# Patient Record
Sex: Male | Born: 1955 | Race: White | Hispanic: No | Marital: Single | Smoking: Current every day smoker
Health system: Southern US, Community
[De-identification: ages and names within clinical notes are randomized; demographics above are authoritative.]

## PROBLEM LIST (undated history)

## (undated) DIAGNOSIS — I4891 Unspecified atrial fibrillation: Secondary | ICD-10-CM

## (undated) DIAGNOSIS — K219 Gastro-esophageal reflux disease without esophagitis: Secondary | ICD-10-CM

## (undated) DIAGNOSIS — D126 Benign neoplasm of colon, unspecified: Secondary | ICD-10-CM

## (undated) DIAGNOSIS — E890 Postprocedural hypothyroidism: Secondary | ICD-10-CM

## (undated) DIAGNOSIS — I428 Other cardiomyopathies: Secondary | ICD-10-CM

## (undated) DIAGNOSIS — E0590 Thyrotoxicosis, unspecified without thyrotoxic crisis or storm: Secondary | ICD-10-CM

## (undated) DIAGNOSIS — B1710 Acute hepatitis C without hepatic coma: Secondary | ICD-10-CM

## (undated) DIAGNOSIS — J449 Chronic obstructive pulmonary disease, unspecified: Secondary | ICD-10-CM

## (undated) DIAGNOSIS — I10 Essential (primary) hypertension: Secondary | ICD-10-CM

## (undated) DIAGNOSIS — D696 Thrombocytopenia, unspecified: Secondary | ICD-10-CM

## (undated) HISTORY — DX: Chronic obstructive pulmonary disease, unspecified: J44.9

## (undated) HISTORY — DX: Other cardiomyopathies: I42.8

## (undated) HISTORY — DX: Essential (primary) hypertension: I10

## (undated) HISTORY — DX: Thrombocytopenia, unspecified: D69.6

## (undated) HISTORY — DX: Gastro-esophageal reflux disease without esophagitis: K21.9

## (undated) HISTORY — DX: Postprocedural hypothyroidism: E89.0

## (undated) HISTORY — DX: Thyrotoxicosis, unspecified without thyrotoxic crisis or storm: E05.90

## (undated) HISTORY — DX: Acute hepatitis C without hepatic coma: B17.10

## (undated) HISTORY — DX: Benign neoplasm of colon, unspecified: D12.6

## (undated) HISTORY — DX: Unspecified atrial fibrillation: I48.91

## (undated) HISTORY — PX: HERNIA REPAIR: SHX51

---

## 1999-09-17 ENCOUNTER — Encounter: Payer: Self-pay | Admitting: Emergency Medicine

## 1999-09-17 ENCOUNTER — Emergency Department (HOSPITAL_COMMUNITY): Admission: EM | Admit: 1999-09-17 | Discharge: 1999-09-17 | Payer: Self-pay | Admitting: Emergency Medicine

## 2005-10-31 ENCOUNTER — Emergency Department (HOSPITAL_COMMUNITY): Admission: EM | Admit: 2005-10-31 | Discharge: 2005-10-31 | Payer: Self-pay | Admitting: Emergency Medicine

## 2005-11-02 ENCOUNTER — Ambulatory Visit (HOSPITAL_COMMUNITY): Admission: RE | Admit: 2005-11-02 | Discharge: 2005-11-02 | Payer: Self-pay | Admitting: General Surgery

## 2005-11-02 ENCOUNTER — Encounter (INDEPENDENT_AMBULATORY_CARE_PROVIDER_SITE_OTHER): Payer: Self-pay | Admitting: *Deleted

## 2008-07-25 ENCOUNTER — Inpatient Hospital Stay (HOSPITAL_COMMUNITY): Admission: EM | Admit: 2008-07-25 | Discharge: 2008-07-29 | Payer: Self-pay | Admitting: Emergency Medicine

## 2008-07-25 ENCOUNTER — Ambulatory Visit: Payer: Self-pay | Admitting: Endocrinology

## 2008-07-25 ENCOUNTER — Ambulatory Visit: Payer: Self-pay | Admitting: Cardiology

## 2008-07-27 ENCOUNTER — Encounter: Payer: Self-pay | Admitting: Cardiology

## 2008-07-28 ENCOUNTER — Ambulatory Visit: Payer: Self-pay | Admitting: Oncology

## 2008-07-30 ENCOUNTER — Ambulatory Visit: Payer: Self-pay | Admitting: Oncology

## 2008-07-31 ENCOUNTER — Ambulatory Visit: Payer: Self-pay | Admitting: Cardiology

## 2008-08-03 ENCOUNTER — Ambulatory Visit: Payer: Self-pay | Admitting: Endocrinology

## 2008-08-03 DIAGNOSIS — D696 Thrombocytopenia, unspecified: Secondary | ICD-10-CM

## 2008-08-03 DIAGNOSIS — J449 Chronic obstructive pulmonary disease, unspecified: Secondary | ICD-10-CM

## 2008-08-03 DIAGNOSIS — I4891 Unspecified atrial fibrillation: Secondary | ICD-10-CM

## 2008-08-03 DIAGNOSIS — J4489 Other specified chronic obstructive pulmonary disease: Secondary | ICD-10-CM | POA: Insufficient documentation

## 2008-08-03 DIAGNOSIS — I10 Essential (primary) hypertension: Secondary | ICD-10-CM | POA: Insufficient documentation

## 2008-08-03 DIAGNOSIS — F17200 Nicotine dependence, unspecified, uncomplicated: Secondary | ICD-10-CM

## 2008-08-03 HISTORY — DX: Essential (primary) hypertension: I10

## 2008-08-03 HISTORY — DX: Chronic obstructive pulmonary disease, unspecified: J44.9

## 2008-08-03 HISTORY — DX: Unspecified atrial fibrillation: I48.91

## 2008-08-03 HISTORY — DX: Thrombocytopenia, unspecified: D69.6

## 2008-08-03 LAB — CONVERTED CEMR LAB: Free T4: 4.8 ng/dL — ABNORMAL HIGH (ref 0.6–1.6)

## 2008-08-06 ENCOUNTER — Ambulatory Visit: Payer: Self-pay | Admitting: Cardiovascular Disease

## 2008-08-13 ENCOUNTER — Ambulatory Visit: Payer: Self-pay | Admitting: Cardiology

## 2008-08-17 ENCOUNTER — Ambulatory Visit: Payer: Self-pay | Admitting: Cardiology

## 2008-08-18 ENCOUNTER — Encounter: Payer: Self-pay | Admitting: Endocrinology

## 2008-08-18 LAB — CBC WITH DIFFERENTIAL/PLATELET
BASO%: 0.3 % (ref 0.0–2.0)
Basophils Absolute: 0 10*3/uL (ref 0.0–0.1)
Eosinophils Absolute: 0.1 10*3/uL (ref 0.0–0.5)
HCT: 39.9 % (ref 38.7–49.9)
HGB: 13.4 g/dL (ref 13.0–17.1)
MCHC: 33.5 g/dL (ref 32.0–35.9)
MONO#: 0.9 10*3/uL (ref 0.1–0.9)
NEUT#: 4.1 10*3/uL (ref 1.5–6.5)
NEUT%: 58.6 % (ref 40.0–75.0)
WBC: 6.9 10*3/uL (ref 4.0–10.0)
lymph#: 1.9 10*3/uL (ref 0.9–3.3)

## 2008-08-18 LAB — MORPHOLOGY: PLT EST: ADEQUATE

## 2008-08-18 LAB — CHCC SMEAR

## 2008-08-24 ENCOUNTER — Ambulatory Visit: Payer: Self-pay | Admitting: Cardiology

## 2008-08-24 ENCOUNTER — Ambulatory Visit: Payer: Self-pay | Admitting: Endocrinology

## 2008-08-24 LAB — CONVERTED CEMR LAB: Free T4: 2.3 ng/dL — ABNORMAL HIGH (ref 0.6–1.6)

## 2008-08-27 ENCOUNTER — Encounter (INDEPENDENT_AMBULATORY_CARE_PROVIDER_SITE_OTHER): Payer: Self-pay | Admitting: *Deleted

## 2008-08-27 ENCOUNTER — Ambulatory Visit: Payer: Self-pay | Admitting: Internal Medicine

## 2008-08-27 ENCOUNTER — Inpatient Hospital Stay (HOSPITAL_COMMUNITY): Admission: EM | Admit: 2008-08-27 | Discharge: 2008-08-29 | Payer: Self-pay | Admitting: Emergency Medicine

## 2008-08-31 ENCOUNTER — Telehealth (INDEPENDENT_AMBULATORY_CARE_PROVIDER_SITE_OTHER): Payer: Self-pay | Admitting: *Deleted

## 2008-08-31 ENCOUNTER — Ambulatory Visit: Payer: Self-pay | Admitting: Cardiology

## 2008-09-14 ENCOUNTER — Ambulatory Visit: Payer: Self-pay | Admitting: Cardiology

## 2008-09-21 ENCOUNTER — Ambulatory Visit: Payer: Self-pay | Admitting: Endocrinology

## 2008-09-21 LAB — CONVERTED CEMR LAB: Free T4: 1.4 ng/dL (ref 0.6–1.6)

## 2008-09-26 DIAGNOSIS — B1710 Acute hepatitis C without hepatic coma: Secondary | ICD-10-CM

## 2008-09-26 DIAGNOSIS — I428 Other cardiomyopathies: Secondary | ICD-10-CM

## 2008-09-26 HISTORY — DX: Other cardiomyopathies: I42.8

## 2008-09-26 HISTORY — DX: Acute hepatitis C without hepatic coma: B17.10

## 2008-09-28 ENCOUNTER — Ambulatory Visit: Payer: Self-pay | Admitting: Cardiology

## 2008-09-28 ENCOUNTER — Encounter: Payer: Self-pay | Admitting: Cardiology

## 2008-10-05 ENCOUNTER — Ambulatory Visit: Payer: Self-pay | Admitting: Cardiology

## 2008-10-12 ENCOUNTER — Ambulatory Visit: Payer: Self-pay | Admitting: Cardiology

## 2008-10-12 LAB — CONVERTED CEMR LAB
BUN: 11 mg/dL (ref 6–23)
CO2: 31 meq/L (ref 19–32)
Calcium: 8.8 mg/dL (ref 8.4–10.5)
Creatinine, Ser: 0.7 mg/dL (ref 0.4–1.5)
Glucose, Bld: 92 mg/dL (ref 70–99)
Potassium: 4 meq/L (ref 3.5–5.1)
Sodium: 144 meq/L (ref 135–145)

## 2008-10-26 ENCOUNTER — Ambulatory Visit: Payer: Self-pay | Admitting: Cardiology

## 2008-11-02 ENCOUNTER — Ambulatory Visit: Payer: Self-pay | Admitting: Endocrinology

## 2008-11-02 LAB — CONVERTED CEMR LAB: TSH: 0.01 microintl units/mL — ABNORMAL LOW (ref 0.35–5.50)

## 2008-11-19 ENCOUNTER — Encounter: Payer: Self-pay | Admitting: Cardiology

## 2008-11-19 ENCOUNTER — Ambulatory Visit: Payer: Self-pay | Admitting: Gastroenterology

## 2008-11-23 ENCOUNTER — Ambulatory Visit: Payer: Self-pay | Admitting: Cardiology

## 2008-12-07 ENCOUNTER — Ambulatory Visit: Payer: Self-pay | Admitting: Internal Medicine

## 2008-12-14 ENCOUNTER — Ambulatory Visit: Payer: Self-pay | Admitting: Endocrinology

## 2008-12-14 LAB — CONVERTED CEMR LAB: Free T4: 0.4 ng/dL — ABNORMAL LOW (ref 0.6–1.6)

## 2008-12-22 ENCOUNTER — Encounter: Payer: Self-pay | Admitting: *Deleted

## 2008-12-22 ENCOUNTER — Ambulatory Visit: Payer: Self-pay | Admitting: Cardiology

## 2008-12-22 LAB — CONVERTED CEMR LAB
POC INR: 1.7
Protime: 16.2

## 2008-12-30 ENCOUNTER — Ambulatory Visit: Payer: Self-pay | Admitting: Cardiology

## 2009-01-04 ENCOUNTER — Ambulatory Visit: Payer: Self-pay | Admitting: Cardiology

## 2009-01-04 LAB — CONVERTED CEMR LAB
POC INR: 1.7
Protime: 16.1

## 2009-01-18 ENCOUNTER — Ambulatory Visit: Payer: Self-pay | Admitting: Cardiovascular Disease

## 2009-01-27 ENCOUNTER — Encounter: Payer: Self-pay | Admitting: *Deleted

## 2009-02-01 ENCOUNTER — Ambulatory Visit: Payer: Self-pay | Admitting: Endocrinology

## 2009-02-01 LAB — CONVERTED CEMR LAB
Free T4: 0.8 ng/dL (ref 0.6–1.6)
TSH: 0.58 microintl units/mL (ref 0.35–5.50)

## 2009-03-22 ENCOUNTER — Encounter (INDEPENDENT_AMBULATORY_CARE_PROVIDER_SITE_OTHER): Payer: Self-pay | Admitting: Cardiology

## 2009-04-06 ENCOUNTER — Encounter: Payer: Self-pay | Admitting: Cardiology

## 2009-04-06 ENCOUNTER — Encounter: Payer: Self-pay | Admitting: Endocrinology

## 2009-04-12 ENCOUNTER — Ambulatory Visit: Payer: Self-pay | Admitting: Cardiology

## 2009-04-12 LAB — CONVERTED CEMR LAB: POC INR: 1.6

## 2009-04-19 ENCOUNTER — Ambulatory Visit: Payer: Self-pay | Admitting: Endocrinology

## 2009-04-26 ENCOUNTER — Ambulatory Visit: Payer: Self-pay | Admitting: Internal Medicine

## 2009-05-17 ENCOUNTER — Ambulatory Visit: Payer: Self-pay | Admitting: Cardiology

## 2009-05-17 LAB — CONVERTED CEMR LAB: POC INR: 2

## 2009-06-03 ENCOUNTER — Encounter (INDEPENDENT_AMBULATORY_CARE_PROVIDER_SITE_OTHER): Payer: Self-pay | Admitting: *Deleted

## 2009-06-14 ENCOUNTER — Ambulatory Visit: Payer: Self-pay | Admitting: Cardiovascular Disease

## 2009-06-14 LAB — CONVERTED CEMR LAB: POC INR: 2.1

## 2009-07-05 ENCOUNTER — Encounter (INDEPENDENT_AMBULATORY_CARE_PROVIDER_SITE_OTHER): Payer: Self-pay | Admitting: *Deleted

## 2009-07-05 ENCOUNTER — Ambulatory Visit: Payer: Self-pay | Admitting: Gastroenterology

## 2009-07-05 LAB — CONVERTED CEMR LAB
ALT: 23 units/L (ref 0–53)
Albumin: 3.5 g/dL (ref 3.5–5.2)
Alkaline Phosphatase: 66 units/L (ref 39–117)
Basophils Relative: 0 % (ref 0.0–3.0)
CO2: 33 meq/L — ABNORMAL HIGH (ref 19–32)
Calcium: 8.9 mg/dL (ref 8.4–10.5)
Creatinine, Ser: 1 mg/dL (ref 0.4–1.5)
Eosinophils Absolute: 0.1 10*3/uL (ref 0.0–0.7)
Eosinophils Relative: 2.1 % (ref 0.0–5.0)
GFR calc non Af Amer: 82.81 mL/min (ref >60)
HCT: 43.6 % (ref 39.0–52.0)
Hemoglobin: 14.9 g/dL (ref 13.0–17.0)
MCV: 95.2 fL (ref 78.0–100.0)
Neutro Abs: 3.6 10*3/uL (ref 1.4–7.7)
Neutrophils Relative %: 52.5 % (ref 43.0–77.0)
Potassium: 5 meq/L (ref 3.5–5.1)
RDW: 12.9 % (ref 11.5–14.6)
Total Protein: 6.6 g/dL (ref 6.0–8.3)

## 2009-07-12 ENCOUNTER — Ambulatory Visit: Payer: Self-pay | Admitting: Endocrinology

## 2009-07-12 ENCOUNTER — Ambulatory Visit: Payer: Self-pay | Admitting: Internal Medicine

## 2009-07-12 LAB — CONVERTED CEMR LAB
POC INR: 2.2
TSH: 14.21 microintl units/mL — ABNORMAL HIGH (ref 0.35–5.50)

## 2009-07-24 LAB — HM COLONOSCOPY

## 2009-08-02 ENCOUNTER — Ambulatory Visit: Payer: Self-pay | Admitting: Gastroenterology

## 2009-08-04 ENCOUNTER — Encounter: Payer: Self-pay | Admitting: Gastroenterology

## 2009-08-09 ENCOUNTER — Ambulatory Visit: Payer: Self-pay | Admitting: Cardiology

## 2009-08-09 LAB — CONVERTED CEMR LAB: POC INR: 1.7

## 2009-08-16 ENCOUNTER — Ambulatory Visit: Payer: Self-pay | Admitting: Cardiology

## 2009-08-16 ENCOUNTER — Telehealth (INDEPENDENT_AMBULATORY_CARE_PROVIDER_SITE_OTHER): Payer: Self-pay | Admitting: *Deleted

## 2009-08-17 ENCOUNTER — Encounter: Payer: Self-pay | Admitting: Cardiology

## 2009-08-23 ENCOUNTER — Ambulatory Visit: Payer: Self-pay | Admitting: Cardiology

## 2009-08-23 LAB — CONVERTED CEMR LAB: POC INR: 1.5

## 2009-09-02 ENCOUNTER — Ambulatory Visit: Payer: Self-pay | Admitting: Cardiology

## 2009-09-07 ENCOUNTER — Telehealth: Payer: Self-pay | Admitting: Endocrinology

## 2009-09-09 ENCOUNTER — Ambulatory Visit: Payer: Self-pay | Admitting: Cardiology

## 2009-09-16 ENCOUNTER — Ambulatory Visit: Payer: Self-pay | Admitting: Cardiology

## 2009-09-23 ENCOUNTER — Ambulatory Visit: Payer: Self-pay | Admitting: Cardiology

## 2009-09-30 ENCOUNTER — Ambulatory Visit: Payer: Self-pay | Admitting: Cardiology

## 2009-10-07 ENCOUNTER — Ambulatory Visit: Payer: Self-pay | Admitting: Internal Medicine

## 2009-10-07 LAB — CONVERTED CEMR LAB: POC INR: 2

## 2009-10-11 ENCOUNTER — Ambulatory Visit: Payer: Self-pay | Admitting: Endocrinology

## 2009-10-14 ENCOUNTER — Ambulatory Visit: Payer: Self-pay | Admitting: Cardiology

## 2009-10-21 ENCOUNTER — Ambulatory Visit: Payer: Self-pay | Admitting: Internal Medicine

## 2009-10-29 ENCOUNTER — Ambulatory Visit: Payer: Self-pay | Admitting: Cardiology

## 2009-11-03 ENCOUNTER — Encounter: Payer: Self-pay | Admitting: Cardiology

## 2009-11-05 ENCOUNTER — Ambulatory Visit: Payer: Self-pay | Admitting: Cardiology

## 2009-11-08 ENCOUNTER — Ambulatory Visit: Payer: Self-pay | Admitting: Cardiovascular Disease

## 2009-11-08 ENCOUNTER — Ambulatory Visit (HOSPITAL_COMMUNITY): Admission: RE | Admit: 2009-11-08 | Discharge: 2009-11-08 | Payer: Self-pay | Admitting: Cardiovascular Disease

## 2009-11-09 ENCOUNTER — Telehealth: Payer: Self-pay | Admitting: Cardiovascular Disease

## 2009-11-15 ENCOUNTER — Ambulatory Visit: Payer: Self-pay | Admitting: Cardiovascular Disease

## 2009-12-13 ENCOUNTER — Ambulatory Visit: Payer: Self-pay | Admitting: Cardiology

## 2009-12-13 LAB — CONVERTED CEMR LAB: POC INR: 2.3

## 2010-01-10 ENCOUNTER — Ambulatory Visit: Payer: Self-pay | Admitting: Cardiology

## 2010-01-10 LAB — CONVERTED CEMR LAB: POC INR: 1.8

## 2010-02-07 ENCOUNTER — Ambulatory Visit: Payer: Self-pay | Admitting: Cardiovascular Disease

## 2010-02-07 ENCOUNTER — Ambulatory Visit: Payer: Self-pay | Admitting: Cardiology

## 2010-02-14 ENCOUNTER — Ambulatory Visit: Payer: Self-pay

## 2010-02-14 ENCOUNTER — Encounter: Payer: Self-pay | Admitting: Cardiology

## 2010-02-14 ENCOUNTER — Ambulatory Visit (HOSPITAL_COMMUNITY): Admission: RE | Admit: 2010-02-14 | Discharge: 2010-02-14 | Payer: Self-pay | Admitting: Cardiology

## 2010-02-14 ENCOUNTER — Ambulatory Visit: Payer: Self-pay | Admitting: Cardiology

## 2010-02-21 ENCOUNTER — Ambulatory Visit: Payer: Self-pay | Admitting: Internal Medicine

## 2010-02-21 ENCOUNTER — Ambulatory Visit: Payer: Self-pay | Admitting: Endocrinology

## 2010-02-21 LAB — CONVERTED CEMR LAB: POC INR: 2

## 2010-03-14 ENCOUNTER — Ambulatory Visit: Payer: Self-pay | Admitting: Internal Medicine

## 2010-03-14 LAB — CONVERTED CEMR LAB: POC INR: 2.9

## 2010-04-04 ENCOUNTER — Ambulatory Visit: Payer: Self-pay | Admitting: Endocrinology

## 2010-04-04 LAB — CONVERTED CEMR LAB: TSH: 0.12 microintl units/mL — ABNORMAL LOW (ref 0.35–5.50)

## 2010-04-11 ENCOUNTER — Ambulatory Visit: Payer: Self-pay | Admitting: Internal Medicine

## 2010-04-18 ENCOUNTER — Encounter (HOSPITAL_COMMUNITY)
Admission: RE | Admit: 2010-04-18 | Discharge: 2010-07-17 | Payer: Self-pay | Source: Home / Self Care | Attending: Endocrinology | Admitting: Endocrinology

## 2010-04-21 ENCOUNTER — Telehealth (INDEPENDENT_AMBULATORY_CARE_PROVIDER_SITE_OTHER): Payer: Self-pay | Admitting: *Deleted

## 2010-05-04 ENCOUNTER — Ambulatory Visit (HOSPITAL_COMMUNITY): Admission: RE | Admit: 2010-05-04 | Discharge: 2010-05-04 | Payer: Self-pay | Admitting: Endocrinology

## 2010-05-09 ENCOUNTER — Ambulatory Visit: Payer: Self-pay | Admitting: Cardiology

## 2010-05-09 LAB — CONVERTED CEMR LAB: POC INR: 3.1

## 2010-05-30 ENCOUNTER — Ambulatory Visit: Payer: Self-pay | Admitting: Endocrinology

## 2010-06-03 ENCOUNTER — Telehealth: Payer: Self-pay | Admitting: Cardiology

## 2010-06-06 ENCOUNTER — Ambulatory Visit: Payer: Self-pay | Admitting: Cardiology

## 2010-07-04 ENCOUNTER — Ambulatory Visit: Payer: Self-pay | Admitting: Cardiology

## 2010-07-04 ENCOUNTER — Ambulatory Visit: Payer: Self-pay | Admitting: Endocrinology

## 2010-07-04 LAB — CONVERTED CEMR LAB
Free T4: 0.23 ng/dL — ABNORMAL LOW (ref 0.60–1.60)
POC INR: 1.2
TSH: 23.41 microintl units/mL — ABNORMAL HIGH (ref 0.35–5.50)

## 2010-07-14 ENCOUNTER — Ambulatory Visit: Payer: Self-pay | Admitting: Cardiology

## 2010-07-14 LAB — CONVERTED CEMR LAB: POC INR: 1.5

## 2010-07-19 ENCOUNTER — Ambulatory Visit: Payer: Self-pay | Admitting: Endocrinology

## 2010-07-23 LAB — CONVERTED CEMR LAB: Free T4: 0.44 ng/dL — ABNORMAL LOW (ref 0.60–1.60)

## 2010-08-01 ENCOUNTER — Ambulatory Visit: Admission: RE | Admit: 2010-08-01 | Discharge: 2010-08-01 | Payer: Self-pay | Source: Home / Self Care

## 2010-08-15 ENCOUNTER — Ambulatory Visit: Admission: RE | Admit: 2010-08-15 | Discharge: 2010-08-15 | Payer: Self-pay | Source: Home / Self Care

## 2010-08-15 ENCOUNTER — Ambulatory Visit: Admit: 2010-08-15 | Payer: Self-pay | Admitting: Endocrinology

## 2010-08-15 LAB — CONVERTED CEMR LAB: POC INR: 1.7

## 2010-08-19 ENCOUNTER — Encounter: Payer: Self-pay | Admitting: Cardiology

## 2010-08-22 ENCOUNTER — Ambulatory Visit
Admission: RE | Admit: 2010-08-22 | Discharge: 2010-08-22 | Payer: Self-pay | Source: Home / Self Care | Attending: Cardiology | Admitting: Cardiology

## 2010-08-22 ENCOUNTER — Ambulatory Visit
Admission: RE | Admit: 2010-08-22 | Discharge: 2010-08-22 | Payer: Self-pay | Source: Home / Self Care | Attending: Endocrinology | Admitting: Endocrinology

## 2010-08-22 ENCOUNTER — Other Ambulatory Visit: Payer: Self-pay | Admitting: Endocrinology

## 2010-08-22 DIAGNOSIS — E890 Postprocedural hypothyroidism: Secondary | ICD-10-CM

## 2010-08-22 DIAGNOSIS — E039 Hypothyroidism, unspecified: Secondary | ICD-10-CM | POA: Insufficient documentation

## 2010-08-22 HISTORY — DX: Postprocedural hypothyroidism: E89.0

## 2010-08-22 LAB — TSH: TSH: 11.96 u[IU]/mL — ABNORMAL HIGH (ref 0.35–5.50)

## 2010-08-23 NOTE — Assessment & Plan Note (Signed)
Summary: ONE MONTH FOLLOW UP-LB   Vital Signs:  Patient profile:   55 year old male Height:      73 inches (185.42 cm) Weight:      181.25 pounds (82.39 kg) BMI:     24.00 O2 Sat:      95 % on Room air Temp:     97.9 degrees F (36.61 degrees C) oral Pulse rate:   61 / minute BP sitting:   92 / 60  (left arm) Cuff size:   regular  Vitals Entered By: Brenton Grills MA (April 04, 2010 9:15 AM)  O2 Flow:  Room air CC: 1 month F/U/aj   Referring Provider:  Earl Lites, MD Primary Provider:  Earl Lites, MD  CC:  1 month F/U/aj.  History of Present Illness: pt says he feels no different since off his ptu.  he hasn't yet followed-up with hepatology.  denies palpitations.  Current Medications (verified): 1)  Metoprolol Succinate 200 Mg Xr24h-Tab (Metoprolol Succinate) .... Take 1/2 Tablet Daily 2)  Lisinopril 5 Mg Tabs (Lisinopril) .... Take One Tablet By Mouth Daily 3)  Coumadin 5 Mg Tabs (Warfarin Sodium) .... Take As Directed By Coumadin Clinic.  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  He is a Investment banker, operational.   He is unmarried.  He is a 40 pack-year smoker.  Social EtOH.  He denies  drug use.  No regular exercise.   Review of Systems  The patient denies fever.    Physical Exam  General:  normal appearance.   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   Heart:  irreg rhythm, normal rate. Psych:  Alert and cooperative; normal mood and affect; normal attention span and concentration.   Additional Exam:  FastTSH              [L]  0.12 uIU/mL                 0.35-5.50 Free T4                   1.29 ng/dL       Impression & Recommendations:  Problem # 1:  HYPERTHYROIDISM  (ICD-242.90) recurrent off ptu  Other Orders: TLB-TSH (Thyroid Stimulating Hormone) (84443-TSH) TLB-T4 (Thyrox), Free [phone]) Radiology Referral (Radiology) Est. Patient Level III (40981)  Patient Instructions: 1)  blood tests are being ordered for you today.  please call [phone] to hear your test results. 2)  based on the results. i would probably recommend you have a radioactove iodine pill to treat the thyroid.  to do this, you would first have a thyroid test, then go back for the treatment.   3)  please stay-off the propylthiouracil for now.   4)  (update: i left message on phone-tree:  i have ordered scan).

## 2010-08-23 NOTE — Assessment & Plan Note (Signed)
Summary: post hospital new /per [email or web address]   Vital Signs:  Patient Profile:   55 Years Old Male Weight:      140.2 pounds O2 Sat:      92 % O2 treatment:    Room Air Temp:     97.6 degrees F rectal Pulse rate:   86 / minute BP sitting:   132 / 76  (right arm) Cuff size:   small  Pt. in pain?   no  Vitals Entered By: Orlan Leavens (August 03, 2008 10:28 AM)              Is Patient Diabetic? No     Referred by:  Warnell Forester PCP:  daub  Chief Complaint:  NEW ENDO/ HYPERTHROID.  History of Present Illness: pt feels much better in general since he was d/c'ed from the hospital.  in particular, he has less temor of the hands.    Prior Medications Reviewed Using: Patient Recall  Updated Prior Medication List: METOPROLOL SUCCINATE 200 MG XR24H-TAB (METOPROLOL SUCCINATE) TAKE 1 by mouth QD COUMADIN 5 MG TABS (WARFARIN SODIUM) TAKE 1 by mouth QD PROPYLTHIOURACIL 50 MG TABS (PROPYLTHIOURACIL) TAKE 4 three times a day (TOTAL 200MG  once daily)  Current Allergies: No known allergies   Past Medical History:    Reviewed history from 08/03/2008 and no changes required:       Atrial fibrillation       COPD       Hypertension       Hyperthyroidism     Review of Systems  The patient denies fever.     Physical Exam  General:     lean.  no distress Mouth:     post nasal drip.   Neck:     thyroid is 2 x normal size, left > right, no nodule Additional Exam:     FREE T4              [H]  4.8 ng/dL                   1.1-9.1 FastTSH              [L]  0.04 uIU/mL                 0.35-5.50     Impression & Recommendations:  Problem # 1:  HYPERTHYROIDISM (ICD-242.90) clinically better, but labs still very high   Patient Instructions: 1)  keep cardiol appt 2)  same ptu (200-tid) 3)  ret 21d

## 2010-08-23 NOTE — Progress Notes (Signed)
Summary: Appt Scheduled  Pt informed INR 2.54 last night.  Continue same dose of 2 tablets daily except 2.5 tablets Tues and Thurs.  FU appt scheduled Mon 4/25 at 10:45.   ---- Converted from flag ---- ---- 11/08/2009 8:25 AM, Omer Jack wrote: per night message pt INR was 2.54 and pt was told by Bjorn Loser PA not to change his coumadin please call to check on patient per [place] PA ------------------------------

## 2010-08-23 NOTE — Assessment & Plan Note (Signed)
Summary: FU/ NWS   Vital Signs:  Patient profile:   55 year old male Height:      73 inches (185.42 cm) Weight:      176.25 pounds (80.11 kg) BMI:     23.34 O2 Sat:      99 % on Room air Temp:     97.9 degrees F (36.61 degrees C) oral Pulse rate:   59 / minute BP sitting:   112 / 74  (left arm) Cuff size:   regular  Vitals Entered By: Brenton Grills CMA Duncan Dull) (May 30, 2010 9:01 AM)  O2 Flow:  Room air CC: Follow-up visit/aj Is Patient Diabetic? No   Referring Provider:  Earl Lites, MD Primary Provider:  Earl Lites, MD  CC:  Follow-up visit/aj.  History of Present Illness: pt is 4 weeks s/p i-131 rx for hyperthyroidism due to grave's dz.  pt states he feels well in general.    Current Medications (verified): 1)  Metoprolol Succinate 200 Mg Xr24h-Tab (Metoprolol Succinate) .... Take 1/2 Tablet Daily 2)  Lisinopril 5 Mg Tabs (Lisinopril) .... Take One Tablet By Mouth Daily 3)  Coumadin 5 Mg Tabs (Warfarin Sodium) .... Take As Directed By Coumadin Clinic.  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    Physical Exam  General:  normal appearance.   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   Neurologic:  no tremor Skin:  not diaphoretic Additional Exam:  FastTSH              [L]  0.01 uIU/mL                 0.35-5.50 Free T4              [H]  4.59 ng/dL                  0.60-1.60    Impression & Recommendations:  Problem # 1:  HYPERTHYROIDISM (ICD-242.90) not better yet  Problem # 2:  ATRIAL FIBRILLATION (ICD-427.31) in view of this, he needs prompt improvement in his tft  Problem # 3:  coumadin rx interacts with tapazole  Medications Added to Medication List This Visit: 1)   Methimazole 10 Mg Tabs (Methimazole) .... 2 tabs two times a day  Other Orders: TLB-TSH (Thyroid Stimulating Hormone) (84443-TSH) TLB-T4 (Thyrox), Free [phone]) Est. Patient Level III (40981)  Patient Instructions: 1)  blood tests are being ordered for you today.  please call [phone] to hear your test results. 2)  Please schedule a follow-up appointment in 1 month. 3)  (update: i left message on phone-tree:  start methimazole 20 mg two times a day). 4)  ashely, please call coumadin clinic, and advise them of methimazole rx. Prescriptions: METHIMAZOLE 10 MG TABS (METHIMAZOLE) 2 tabs two times a day  #120 x 1   Entered and Authorized by:   Minus Breeding MD   Signed by:   Minus Breeding MD on 05/30/2010   Method used:   Electronically to        [facility] [address] [phone]* (retail)       [address]       [place], [place]  [address]  Ph: [phone] or [phone]       Fax: [phone]   RxID:   [id number]    Orders Added: 1)  TLB-TSH (Thyroid Stimulating Hormone) [84443-TSH] 2)  TLB-T4 (Thyrox), Free [[id number]] 3)  Est. Patient Level III [36644]  Appended Document: FU/ NWS informed [facility]on R[facility] of Methimazole rx. pt has appt there next week/AJ

## 2010-08-23 NOTE — Medication Information (Signed)
Summary: rov/tm  Anticoagulant Therapy  Managed by: Bethena Midget, RN, BSN PCP: Earl Lites, MD Supervising MD: Myrtis Ser MD, Tinnie Gens Indication 1: Atrial Fibrillation (ICD-427.31) Indication 2: DCCV Pending (08/17/2009) see weekly Lab Used: [facility] Site: [facility] INR POC 2.2 INR RANGE 2 - 3  Dietary changes: no    Health status changes: no    Bleeding/hemorrhagic complications: no    Recent/future hospitalizations: no    Any changes in medication regimen? no    Recent/future dental: no  Any missed doses?: no       Is patient compliant with meds? yes      Comments: Pending DCCV  Allergies: No Known Drug Allergies  Anticoagulation Management History:      The patient is taking warfarin and comes in today for a routine follow up visit.  Negative risk factors for bleeding include an age less than 59 years old.  The bleeding index is 'low risk'.  Positive CHADS2 values include History of HTN.  Negative CHADS2 values include Age > 72 years old.  The start date was 07/27/2008.  Anticoagulation responsible provider: Myrtis Ser MD, Tinnie Gens.  INR POC: 2.2.  Cuvette Lot#: [id number].  Exp: 10/2010.    Anticoagulation Management Assessment/Plan:      The patient's current anticoagulation dose is Coumadin 5 mg tabs: Take as directed by coumadin clinic..  The target INR is 2 - 3.  The next INR is due 09/16/2009.  Anticoagulation instructions were given to patient.  Results were reviewed/authorized by Bethena Midget, RN, BSN.  He was notified by Bethena Midget, RN, BSN.         Prior Anticoagulation Instructions: INR 1.6 Today take 12.5mg s then continue 7.5mg s daily except 10mg s on Mondays, Wednesdays and Fridays. Recheck in one week   Current Anticoagulation Instructions: INR 2.2 Continue 1.5 pills everyday except 2 pills on Mondays, Wednesdays and Fridays. Recheck in one week.  Pending DCCV

## 2010-08-23 NOTE — Progress Notes (Signed)
Summary: pt returned call  [facility])   Phone Note Call from Patient   Caller: Patient [phone] Reason for Call: Talk to Nurse Summary of Call: pt returned call Initial call taken by: Glynda Jaeger,  June 03, 2010 10:11 AM  Follow-up for Phone Call        I could not find a message that some one had called pt. LMTCB. Ollen Gross, RN, BSN  June 03, 2010 11:07 AM left msg for pt to callback on home phone. rna on business phone 06/06/10 0820 11/14 0933 pt states that he was called from here. unable to find where pt call originally. pt fine and no problems. will close call record.    Follow-up by: Claris Gladden RN,  June 06, 2010 9:36 AM

## 2010-08-23 NOTE — Medication Information (Signed)
Summary: rov/tm  Anticoagulant Therapy  Managed by: Cloyde Reams, RN, BSN PCP: Earl Lites, MD Supervising MD: Shirlee Latch MD, Darryll Raju Indication 1: Atrial Fibrillation (ICD-427.31) Indication 2: DCCV Pending (08/17/2009) see weekly Lab Used: [facility] Site: [facility] INR POC 1.5 INR RANGE 2 - 3  Dietary changes: no    Health status changes: no    Bleeding/hemorrhagic complications: no    Recent/future hospitalizations: no    Any changes in medication regimen? no    Recent/future dental: no  Any missed doses?: no       Is patient compliant with meds? yes       Allergies (verified): No Known Drug Allergies  Anticoagulation Management History:      The patient is taking warfarin and comes in today for a routine follow up visit.  Negative risk factors for bleeding include an age less than 50 years old.  The bleeding index is 'low risk'.  Positive CHADS2 values include History of HTN.  Negative CHADS2 values include Age > 35 years old.  The start date was 07/27/2008.  Anticoagulation responsible provider: Shirlee Latch MD, Jensen Cheramie.  INR POC: 1.5.  Cuvette Lot#: [id number].  Exp: 10/2010.    Anticoagulation Management Assessment/Plan:      The patient's current anticoagulation dose is Coumadin 5 mg tabs: Take as directed by coumadin clinic..  The target INR is 2 - 3.  The next INR is due 08/30/2009.  Anticoagulation instructions were given to patient.  Results were reviewed/authorized by Cloyde Reams, RN, BSN.  He was notified by Cloyde Reams RN.         Prior Anticoagulation Instructions: INR 1.7  Take 2.5 tablets today and 2 tablets tomorrow then resume current dose of 1.5 tablets daily except 2 tablets on Mondays and Fridays. Recheck in 3 weeks.  Current Anticoagulation Instructions: INR 1.5  Take 3 tablets today then start taking 1.5 tablets daily except 2 tablets on Mondays, Wednesdays, and Fridays.  Recheck in 1 week.

## 2010-08-23 NOTE — Medication Information (Signed)
Summary: rov/sp  Anticoagulant Therapy  Managed by: Weston Brass, PharmD PCP: Earl Lites, MD Supervising MD: Patty Sermons Indication 1: Atrial Fibrillation (ICD-427.31) Indication 2: DCCV Pending (08/17/2009) see weekly Lab Used: [facility] Site: [facility] INR POC 3.1 INR RANGE 2 - 3  Dietary changes: no    Health status changes: no    Bleeding/hemorrhagic complications: no    Recent/future hospitalizations: no    Any changes in medication regimen? no    Recent/future dental: no  Any missed doses?: no       Is patient compliant with meds? yes       Allergies: No Known Drug Allergies  Anticoagulation Management History:      The patient is taking warfarin and comes in today for a routine follow up visit.  Negative risk factors for bleeding include an age less than 62 years old.  The bleeding index is 'low risk'.  Positive CHADS2 values include History of HTN.  Negative CHADS2 values include Age > 29 years old.  The start date was 07/27/2008.  His last INR was 2.54.  Anticoagulation responsible Sivan Cuello: [facility].  INR POC: 3.1.  Cuvette Lot#: [id number].  Exp: 04/2011.    Anticoagulation Management Assessment/Plan:      The patient's current anticoagulation dose is Coumadin 5 mg tabs: Take as directed by coumadin clinic..  The target INR is 2 - 3.  The next INR is due 06/06/2010.  Anticoagulation instructions were given to patient.  Results were reviewed/authorized by Weston Brass, PharmD.  He was notified by Haynes Hoehn, PharmD Candidate.         Prior Anticoagulation Instructions: INR 2.3  Continue same dose of 2 tablets every day except 2 1/2 tablets on Tuesday, Thursday and Saturday.  Recheck INR in 4 weeks.   Current Anticoagulation Instructions: INR 3.1   Take 1 tablet today, and try to eat a few more green, leafy vegetables throughout the week.  Continue Coumadin as scheduled:  2 tablets every day of the week, except 2 and 1/2 tablets on Tuesday, Thursday, and  Saturday.  Return to clinic in 4 weeks.

## 2010-08-23 NOTE — Medication Information (Signed)
Summary: rov/ewj  Anticoagulant Therapy  Managed by: Eda Keys, PharmD PCP: Earl Lites, MD Supervising MD: Ladona Ridgel MD, Sharlot Gowda Indication 1: Atrial Fibrillation (ICD-427.31) Indication 2: DCCV Pending (08/17/2009) see weekly Lab Used: [facility] Site: [facility] INR POC 2.0 INR RANGE 2 - 3  Dietary changes: no    Health status changes: no    Bleeding/hemorrhagic complications: no    Recent/future hospitalizations: no    Any changes in medication regimen? no    Recent/future dental: no  Any missed doses?: no       Is patient compliant with meds? yes      Comments: Patient pending DCCV  Allergies: No Known Drug Allergies  Anticoagulation Management History:      The patient is taking warfarin and comes in today for a routine follow up visit.  Negative risk factors for bleeding include an age less than 47 years old.  The bleeding index is 'low risk'.  Positive CHADS2 values include History of HTN.  Negative CHADS2 values include Age > 79 years old.  The start date was 07/27/2008.  Anticoagulation responsible provider: Ladona Ridgel MD, Sharlot Gowda.  INR POC: 2.0.  Cuvette Lot#: [id number].  Exp: 11/2010.    Anticoagulation Management Assessment/Plan:      The patient's current anticoagulation dose is Coumadin 5 mg tabs: Take as directed by coumadin clinic..  The target INR is 2 - 3.  The next INR is due 10/14/2009.  Anticoagulation instructions were given to patient.  Results were reviewed/authorized by Eda Keys, PharmD.  He was notified by Eda Keys.         Prior Anticoagulation Instructions: INR 1.8  Take 2 tablets daily.  Recheck in 1 week.    Current Anticoagulation Instructions: INR 2.0  Start NEW dosing schedule of 2.5 tablets on Tuesday and Thursday and 2 tablets all other days.  Return to clinic in 1 week.

## 2010-08-23 NOTE — Medication Information (Signed)
Summary: rov/ewj  Anticoagulant Therapy  Managed by: Shelby Dubin, PharmD PCP: Earl Lites, MD Supervising MD: Jens Som MD, Arlys John Indication 1: Atrial Fibrillation (ICD-427.31) Lab Used: [facility] Site: [facility] INR POC 1.7 INR RANGE 2 - 3  Dietary changes: no    Health status changes: no    Bleeding/hemorrhagic complications: no    Recent/future hospitalizations: no    Any changes in medication regimen? no    Recent/future dental: no  Any missed doses?: yes     Details: Patient had colonoscopy 1/10. Restarted coumadin 1/11.  Is patient compliant with meds? yes       Allergies: No Known Drug Allergies  Anticoagulation Management History:      The patient is taking warfarin and comes in today for a routine follow up visit.  Negative risk factors for bleeding include an age less than 55 years old.  The bleeding index is 'low risk'.  Positive CHADS2 values include History of HTN.  Negative CHADS2 values include Age > 55 years old.  The start date was 07/27/2008.  Anticoagulation responsible provider: Jens Som MD, Arlys John.  INR POC: 1.7.  Cuvette Lot#: [id number].  Exp: 10/2010.    Anticoagulation Management Assessment/Plan:      The patient's current anticoagulation dose is Coumadin 5 mg tabs: Take as directed by coumadin clinic..  The target INR is 2 - 3.  The next INR is due 08/30/2009.  Anticoagulation instructions were given to patient.  Results were reviewed/authorized by Shelby Dubin, PharmD.  He was notified by Lew Dawes, PharmD Candidate.         Prior Anticoagulation Instructions: INR 2.2  Continue on same dosage 1.5 tablets daily except 2 tablets on Mondays and Fridays.  Recheck in 1 week after colonoscopy.    Current Anticoagulation Instructions: INR 1.7  Take 2.5 tablets today and 2 tablets tomorrow then resume current dose of 1.5 tablets daily except 2 tablets on Mondays and Fridays. Recheck in 3 weeks.

## 2010-08-23 NOTE — Medication Information (Signed)
Summary: rov/cs      Allergies Added: NKDA Anticoagulant Therapy  Managed by: Weston Brass, PharmD PCP: Earl Lites, MD Supervising MD: Shirlee Latch MD, Quy Lotts Indication 1: Atrial Fibrillation (ICD-427.31) Indication 2: DCCV Pending (08/17/2009) see weekly Lab Used: [facility] Site: [facility] INR POC 2.0 INR RANGE 2 - 3  Dietary changes: no    Health status changes: no    Bleeding/hemorrhagic complications: no    Recent/future hospitalizations: no    Any changes in medication regimen? no    Recent/future dental: no  Any missed doses?: yes     Details: Missed a dose on Thursday  Is patient compliant with meds? yes       Allergies (verified): No Known Drug Allergies  Anticoagulation Management History:      The patient is taking warfarin and comes in today for a routine follow up visit.  Negative risk factors for bleeding include an age less than 66 years old.  The bleeding index is 'low risk'.  Positive CHADS2 values include History of HTN.  Negative CHADS2 values include Age > 62 years old.  The start date was 07/27/2008.  His last INR was 2.54.  Anticoagulation responsible provider: Shirlee Latch MD, Henri Baumler.  INR POC: 2.0.  Cuvette Lot#: [id number].  Exp: 06/2011.    Anticoagulation Management Assessment/Plan:      The patient's current anticoagulation dose is Coumadin 5 mg tabs: Take as directed by coumadin clinic..  The target INR is 2 - 3.  The next INR is due 07/04/2010.  Anticoagulation instructions were given to patient.  Results were reviewed/authorized by Weston Brass, PharmD.  He was notified by Hoy Register, PharmD Candidate.         Prior Anticoagulation Instructions: INR 3.1   Take 1 tablet today, and try to eat a few more green, leafy vegetables throughout the week.  Continue Coumadin as scheduled:  2 tablets every day of the week, except 2 and 1/2 tablets on Tuesday, Thursday, and Saturday.  Return to clinic in 4 weeks.    Current Anticoagulation Instructions: INR  2.0 Continue previous dose of 2 tablets everyday except 2.5 tablets on Tuesday, Thursday, and Saturday. Recheck INR in 4 weeks.

## 2010-08-23 NOTE — Letter (Signed)
Summary: Patient Notice- Polyp Results  [facility]  [address] [place], [place] [address]   Phone: [phone]  Fax: [phone]        August 04, 2009 MRN: [id number]    Donald Myers [address] [place], [place]  [address]    Dear [name],  I am pleased to inform you that the colon polyp(s) removed during your recent colonoscopy was (were) found to be benign (no cancer detected) upon pathologic examination.  I recommend you have a repeat colonoscopy examination in 5_ years to look for recurrent polyps, as having colon polyps increases your risk for having recurrent polyps or even colon cancer in the future.  Should you develop new or worsening symptoms of abdominal pain, bowel habit changes or bleeding from the rectum or bowels, please schedule an evaluation with either your primary care physician or with me.  Additional information/recommendations:  __ No further action with gastroenterology is needed at this time. Please      follow-up with your primary care physician for your other healthcare      needs.  __ Please call [phone] to schedule a return visit to review your      situation.  __ Please keep your follow-up visit as already scheduled.  _x_ Continue treatment plan as outlined the day of your exam.  Please call us if you are having persistent problems or have questions about your condition that have not been fully answered at this time.  Sincerely,  Louis Meckel MD  This letter has been electronically signed by your physician.  Appended Document: Patient Notice- Polyp Results Letter mailed 1.14.11

## 2010-08-23 NOTE — Medication Information (Signed)
Summary: rov/tm  Anticoagulant Therapy  Managed by: Eda Keys, PharmD PCP: Earl Lites, MD Supervising MD: Antoine Poche MD, Fayrene Fearing Indication 1: Atrial Fibrillation (ICD-427.31) Indication 2: DCCV Pending (08/17/2009) see weekly Lab Used: [facility] Site: [facility] INR POC 2.0 INR RANGE 2 - 3  Dietary changes: no    Health status changes: no    Bleeding/hemorrhagic complications: no    Recent/future hospitalizations: no    Any changes in medication regimen? no    Recent/future dental: no  Any missed doses?: no       Is patient compliant with meds? yes      Comments: Pt pending cardioversion.    Current Medications (verified): 1)  Metoprolol Succinate 200 Mg Xr24h-Tab (Metoprolol Succinate) .... Take 1 By Mouth Qd 2)  Propylthiouracil 50 Mg Tabs (Propylthiouracil) .... Take 4 Tabs Bid 3)  Diltiazem Hcl Cr 180 Mg Xr24h-Cap (Diltiazem Hcl) .... Take 1 By Mouth Qd 4)  Lisinopril 5 Mg Tabs (Lisinopril) .... Take One Tablet By Mouth Daily 5)  Coumadin 5 Mg Tabs (Warfarin Sodium) .... Take As Directed By [facility]. 6)  Chantix Starting Month Pak 0.5 Mg X 11 & 1 Mg X 42 Tabs (Varenicline Tartrate) .... As Directed 7)  Chantix Continuing Month Pak 1 Mg Tabs (Varenicline Tartrate) .... As Directed  Allergies (verified): No Known Drug Allergies  Anticoagulation Management History:      The patient is taking warfarin and comes in today for a routine follow up visit.  Negative risk factors for bleeding include an age less than 59 years old.  The bleeding index is 'low risk'.  Positive CHADS2 values include History of HTN.  Negative CHADS2 values include Age > 72 years old.  The start date was 07/27/2008.  Anticoagulation responsible provider: Antoine Poche MD, Fayrene Fearing.  INR POC: 2.0.  Cuvette Lot#: [id number].  Exp: 10/2010.    Anticoagulation Management Assessment/Plan:      The patient's current anticoagulation dose is Coumadin 5 mg tabs: Take as directed by coumadin clinic..  The  target INR is 2 - 3.  The next INR is due 09/23/2009.  Anticoagulation instructions were given to patient.  Results were reviewed/authorized by Eda Keys, PharmD.  He was notified by Eda Keys.         Prior Anticoagulation Instructions: INR 2.2 Continue 1.5 pills everyday except 2 pills on Mondays, Wednesdays and Fridays. Recheck in one week.  Pending DCCV  Current Anticoagulation Instructions: INR 2.0  Take an extra 1/2 tablet today.  Then return to normal dosing schedule of 2 tablets on Monday, Wednesday, and Friday and 1.5 tablets all other days.  REturn to clinic in 1 week.

## 2010-08-23 NOTE — Medication Information (Signed)
Summary: rov/eac  Anticoagulant Therapy  Managed by: Cloyde Reams, RN, BSN PCP: Earl Lites, MD Supervising MD: Jens Som MD, Arlys John Indication 1: Atrial Fibrillation (ICD-427.31) Indication 2: DCCV Pending (08/17/2009) see weekly Lab Used: [facility] Site: [facility] INR POC 1.8 INR RANGE 2 - 3  Dietary changes: no    Health status changes: no    Bleeding/hemorrhagic complications: no    Recent/future hospitalizations: no    Any changes in medication regimen? no    Recent/future dental: no  Any missed doses?: no       Is patient compliant with meds? yes       Allergies (verified): No Known Drug Allergies  Anticoagulation Management History:      The patient is taking warfarin and comes in today for a routine follow up visit.  Negative risk factors for bleeding include an age less than 22 years old.  The bleeding index is 'low risk'.  Positive CHADS2 values include History of HTN.  Negative CHADS2 values include Age > 32 years old.  The start date was 07/27/2008.  Anticoagulation responsible provider: Jens Som MD, Arlys John.  INR POC: 1.8.  Cuvette Lot#: [id number].  Exp: 11/2010.    Anticoagulation Management Assessment/Plan:      The patient's current anticoagulation dose is Coumadin 5 mg tabs: Take as directed by coumadin clinic..  The target INR is 2 - 3.  The next INR is due 09/30/2009.  Anticoagulation instructions were given to patient.  Results were reviewed/authorized by Cloyde Reams, RN, BSN.  He was notified by Cloyde Reams RN.         Prior Anticoagulation Instructions: INR 2.0  Take an extra 1/2 tablet today.  Then return to normal dosing schedule of 2 tablets on Monday, Wednesday, and Friday and 1.5 tablets all other days.  REturn to clinic in 1 week.  Current Anticoagulation Instructions: INR 1.8  Take 2 tablets today then start taking 2 tablets daily except 1.5 tablets on Sundays, Tuesdays, and Thursdays.  Recheck in 1 week.  Pending cardioversion.

## 2010-08-23 NOTE — Medication Information (Signed)
Summary: rov/eac  Anticoagulant Therapy  Managed by: Bethena Midget, RN, BSN PCP: Earl Lites, MD Supervising MD: Myrtis Ser MD, Tinnie Gens Indication 1: Atrial Fibrillation (ICD-427.31) Indication 2: DCCV Pending (08/17/2009) see weekly Lab Used: [facility] Site: [facility] INR POC 1.8 INR RANGE 2 - 3  Dietary changes: no    Health status changes: no    Bleeding/hemorrhagic complications: no    Recent/future hospitalizations: no    Any changes in medication regimen? no    Recent/future dental: no  Any missed doses?: no       Is patient compliant with meds? yes       Allergies: No Known Drug Allergies  Anticoagulation Management History:      The patient is taking warfarin and comes in today for a routine follow up visit.  Negative risk factors for bleeding include an age less than 53 years old.  The bleeding index is 'low risk'.  Positive CHADS2 values include History of HTN.  Negative CHADS2 values include Age > 37 years old.  The start date was 07/27/2008.  His last INR was 2.54.  Anticoagulation responsible provider: Myrtis Ser MD, Tinnie Gens.  INR POC: 1.8.  Cuvette Lot#: [id number].  Exp: 02/2011.    Anticoagulation Management Assessment/Plan:      The patient's current anticoagulation dose is Coumadin 5 mg tabs: Take as directed by coumadin clinic..  The target INR is 2 - 3.  The next INR is due 02/07/2010.  Anticoagulation instructions were given to patient.  Results were reviewed/authorized by Bethena Midget, RN, BSN.  He was notified by Bethena Midget, RN, BSN.         Prior Anticoagulation Instructions: INR 2.3  Continue taking 2.5 tablets on Tuesday and Thursday and 2 tablets all other days.  Return to clinic in 4 weeks.    Current Anticoagulation Instructions: INR 1.8 Today take 12.5mg s then resume 10mg s everyday except 12.5mg s on Tuesdays and Thursdays. Recheck in 4 weeks.

## 2010-08-23 NOTE — Medication Information (Signed)
Summary: [facility]  Anticoagulant Therapy  Managed by: Shelby Dubin, PharmD PCP: Earl Lites, MD Supervising MD: Jens Som MD, Arlys John Indication 1: Atrial Fibrillation (ICD-427.31) Indication 2: DCCV Pending (08/17/2009) see weekly Lab Used: [facility] Site: [facility] INR RANGE 2 - 3           Allergies: No Known Drug Allergies  Anticoagulation Management History:      Negative risk factors for bleeding include an age less than 19 years old.  The bleeding index is 'low risk'.  Positive CHADS2 values include History of HTN.  Negative CHADS2 values include Age > 70 years old.  The start date was 07/27/2008.  Anticoagulation responsible provider: Jens Som MD, Arlys John.  Exp: 10/2010.    Anticoagulation Management Assessment/Plan:      The patient's current anticoagulation dose is Coumadin 5 mg tabs: Take as directed by coumadin clinic..  The target INR is 2 - 3.  The next INR is due 08/30/2009.  Anticoagulation instructions were given to patient.  Results were reviewed/authorized by Shelby Dubin, PharmD.         Prior Anticoagulation Instructions: INR 1.7  Take 2.5 tablets today and 2 tablets tomorrow then resume current dose of 1.5 tablets daily except 2 tablets on Mondays and Fridays. Recheck in 3 weeks.  Appended Document: [facility]    Anticoagulant Therapy  Managed by: Shelby Dubin, PharmD PCP: Earl Lites, MD Supervising MD: Jens Som MD, Arlys John Indication 1: Atrial Fibrillation (ICD-427.31) Indication 2: DCCV Pending (08/17/2009) see weekly Lab Used: [facility] Site: [facility] INR RANGE 2 - 3           Allergies: No Known Drug Allergies  Anticoagulation Management History:      Negative risk factors for bleeding include an age less than 29 years old.  The bleeding index is 'low risk'.  Positive CHADS2 values include History of HTN.  Negative CHADS2 values include Age > 27 years old.  The start date was 07/27/2008.  Anticoagulation responsible  provider: Jens Som MD, Arlys John.  Exp: 10/2010.    Anticoagulation Management Assessment/Plan:      The patient's current anticoagulation dose is Coumadin 5 mg tabs: Take as directed by coumadin clinic..  The target INR is 2 - 3.  The next INR is due 08/30/2009.  Anticoagulation instructions were given to patient.  Results were reviewed/authorized by Shelby Dubin, PharmD.         Prior Anticoagulation Instructions: INR 1.7  Take 2.5 tablets today and 2 tablets tomorrow then resume current dose of 1.5 tablets daily except 2 tablets on Mondays and Fridays. Recheck in 3 weeks.

## 2010-08-23 NOTE — Progress Notes (Signed)
Summary: Rx?  Phone Note Call from Patient Call back at Home Phone [phone]   Caller: Patient Call For: Minus Breeding MD Summary of Call: Pt states [facility] is telling him he only had 2 refills from Dec 2010 and that office has denied  request for refill of Propylthiouracil. Pharmacy told him to contact this office. Please advise. Initial call taken by: Verdell Face,  September 07, 2009 4:08 PM  Follow-up for Phone Call        I called pharmacy to inform them that pt had an Rx sent 07/13/2009. Pharmacy said that Rx was on hold and they will fill now. I called and informed pt.  Follow-up by: Margaret Pyle, CMA,  September 07, 2009 4:22 PM

## 2010-08-23 NOTE — Medication Information (Signed)
Summary: ccr/jss  Anticoagulant Therapy  Managed by: Cloyde Reams, RN, BSN PCP: Earl Lites, MD Supervising MD: Excell Seltzer MD, Casimiro Needle Indication 1: Atrial Fibrillation (ICD-427.31) Indication 2: DCCV Pending (08/17/2009) see weekly Lab Used: [facility] Site: [facility] INR POC 2.1 INR RANGE 2 - 3  Dietary changes: no    Health status changes: no    Bleeding/hemorrhagic complications: no    Recent/future hospitalizations: no    Any changes in medication regimen? no    Recent/future dental: no  Any missed doses?: no       Is patient compliant with meds? yes      Comments: DCCV done on 11/08/09.    Current Medications (verified): 1)  Metoprolol Succinate 200 Mg Xr24h-Tab (Metoprolol Succinate) .... Take 1/2 Tablet Daily 2)  Propylthiouracil 50 Mg Tabs (Propylthiouracil) .... Take 4 Tabs Bid 3)  Lisinopril 5 Mg Tabs (Lisinopril) .... Take One Tablet By Mouth Daily 4)  Coumadin 5 Mg Tabs (Warfarin Sodium) .... Take As Directed By [facility]madin Clinic.  Allergies (verified): No Known Drug Allergies  Anticoagulation Management History:      The patient is taking warfarin and comes in today for a routine follow up visit.  Negative risk factors for bleeding include an age less than 34 years old.  The bleeding index is 'low risk'.  Positive CHADS2 values include History of HTN.  Negative CHADS2 values include Age > 24 years old.  The start date was 07/27/2008.  His last INR was 2.54.  Anticoagulation responsible provider: Excell Seltzer MD, Casimiro Needle.  INR POC: 2.1.  Cuvette Lot#: [id number].  Exp: 12/2010.    Anticoagulation Management Assessment/Plan:      The patient's current anticoagulation dose is Coumadin 5 mg tabs: Take as directed by coumadin clinic..  The target INR is 2 - 3.  The next INR is due 12/13/2009.  Anticoagulation instructions were given to patient.  Results were reviewed/authorized by Cloyde Reams, RN, BSN.  He was notified by Cloyde Reams RN.         Prior Anticoagulation  Instructions: INR 3.0. Take 2 tablets daily except 2.5 tablets Tues and Thurs.  Current Anticoagulation Instructions: INR 2.1  Continue on same dosage 2 tablets daily except 2.5 tablets on Tuesdays and Thursdays.  Recheck in 4 weeks.

## 2010-08-23 NOTE — Medication Information (Signed)
Summary: [facility]  Anticoagulant Therapy  Managed by: Bethena Midget, RN, BSN PCP: Earl Lites, MD Supervising MD: Daleen Squibb MD, Maisie Fus Indication 1: Atrial Fibrillation (ICD-427.31) Indication 2: DCCV Pending (08/17/2009) see weekly Lab Used: [facility] Site: [facility] INR POC 1.6 INR RANGE 2 - 3  Dietary changes: no    Health status changes: no    Bleeding/hemorrhagic complications: no    Recent/future hospitalizations: no    Any changes in medication regimen? no    Recent/future dental: no  Any missed doses?: yes     Details: Missed Sundays dose  Is patient compliant with meds? yes      Comments: Pending DCCV  Allergies: No Known Drug Allergies  Anticoagulation Management History:      The patient is taking warfarin and comes in today for a routine follow up visit.  Negative risk factors for bleeding include an age less than 13 years old.  The bleeding index is 'low risk'.  Positive CHADS2 values include History of HTN.  Negative CHADS2 values include Age > 59 years old.  The start date was 07/27/2008.  Anticoagulation responsible provider: Daleen Squibb MD, Maisie Fus.  INR POC: 1.6.  Cuvette Lot#: [id number].  Exp: 10/2010.    Anticoagulation Management Assessment/Plan:      The patient's current anticoagulation dose is Coumadin 5 mg tabs: Take as directed by coumadin clinic..  The target INR is 2 - 3.  The next INR is due 09/09/2009.  Anticoagulation instructions were given to patient.  Results were reviewed/authorized by Bethena Midget, RN, BSN.  He was notified by Bethena Midget, RN, BSN.         Prior Anticoagulation Instructions: INR 1.5  Take 3 tablets today then start taking 1.5 tablets daily except 2 tablets on Mondays, Wednesdays, and Fridays.  Recheck in 1 week.    Current Anticoagulation Instructions: INR 1.6 Today take 12.5mg s then continue 7.5mg s daily except 10mg s on Mondays, Wednesdays and Fridays. Recheck in one week

## 2010-08-23 NOTE — Assessment & Plan Note (Signed)
Summary: FU--PER PT D/T---STC   Vital Signs:  Patient profile:   55 year old male Height:      73 inches (185.42 cm) Weight:      186.25 pounds (84.66 kg) BMI:     24.66 O2 Sat:      92 % on Room air Temp:     97.2 degrees F (36.22 degrees C) oral Pulse rate:   48 / minute BP sitting:   104 / 64  (left arm) Cuff size:   regular  Vitals Entered By: Brenton Grills MA (February 21, 2010 10:02 AM)  O2 Flow:  Room air CC: F/U appt/aj   Referring Provider:  Earl Lites, MD Primary Provider:  Earl Lites, MD  CC:  F/U appt/aj.  History of Present Illness: pt states he takes ptu as rx'ed.  pt states he feels well in general.   he sees dr for hepatitis-c, whi said he needed to go-off ptu in order to have rx for this.  no abd pain denies dizziness  Current Medications (verified): 1)  Metoprolol Succinate 200 Mg Xr24h-Tab (Metoprolol Succinate) .... Take 1/2 Tablet Daily 2)  Propylthiouracil 50 Mg Tabs (Propylthiouracil) .... Take 4 Tabs Bid 3)  Lisinopril 5 Mg Tabs (Lisinopril) .... Take One Tablet By Mouth Daily 4)  Coumadin 5 Mg Tabs (Warfarin Sodium) .... Take As Directed By Coumadin Clinic.  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    Physical Exam  General:  normal appearance.   Neck:  thyroid is 5 x normal size, left > right, no nodule Neurologic:  no tremor Skin:  not diaphoretic Additional Exam:   FastTSH              [H]  18.95 uIU/mL    Impression & Recommendations:  Problem # 1:  HYPERTHYROIDISM (ICD-242.90) overcontrolled  Problem # 2:  HEPATITIS C (ICD-070.51) he needs to be off tapazole for this rx  Other Orders: TLB-TSH (Thyroid Stimulating Hormone) (84443-TSH) Est. Patient Level III  (16109)  Patient Instructions: 1)  blood tests are being ordered for you today.  please call [phone] to hear your test results. 2)  pending the test results, please stop the propylthiouracil. 3)  Please schedule a follow-up appointment in 1 month. 4)  cc coumadin clinic

## 2010-08-23 NOTE — Assessment & Plan Note (Signed)
Summary: 6 months rov 427.31  428.22 pfh,rn      Allergies Added: NKDA  Visit Type:  Follow-up Primary Provider:  Earl Lites, MD  CC:  cardiomyopathy and atrial flutter.  History of Present Illness: Tpatient returns for followup of the above. He is status post cardioversion in April. He is doing well. He denies any palpitations, presyncope or syncope. He did have his metoprolol dose reduced because of bradycardia. He denies any shortness of breath, PND or orthopnea. He has had no chest pressure, neck or arm discomfort. He rides his bike 16 miles 5 days a week to work. He has good energy. He has not had followup on his thyroid recently.  Current Medications (verified): 1)  Metoprolol Succinate 200 Mg Xr24h-Tab (Metoprolol Succinate) .... Take 1/2 Tablet Daily 2)  Propylthiouracil 50 Mg Tabs (Propylthiouracil) .... Take 4 Tabs Bid 3)  Lisinopril 5 Mg Tabs (Lisinopril) .... Take One Tablet By Mouth Daily 4)  Coumadin 5 Mg Tabs (Warfarin Sodium) .... Take As Directed By Coumadin Clinic.  Allergies (verified): No Known Drug Allergies  Past History:  Past Medical History: Reviewed history from 09/26/2008 and no changes required. 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 Inguinal hernia repair  Review of Systems       As stated in the HPI and negative for all other systems.   Vital Signs:  Patient profile:   55 year old male Height:      73 inches Weight:      189 pounds BMI:     25.03 Pulse rate:   55 / minute Resp:     16 per minute BP sitting:   118 / 72  (right arm)  Vitals Entered By: Marrion Coy, CNA (February 07, 2010 9:28 AM)  Physical Exam  General:  Well developed, well nourished, in no acute distress. Head:  normocephalic and atraumatic Eyes:  PERRLA/EOM intact; conjunctiva and lids  normal. Mouth:  Poor dentition, gums and palate normal. Oral mucosa normal. Neck:  Neck supple, no JVD. No masses, thyromegaly or abnormal cervical nodes. Chest Wall:  no deformities or breast masses noted Lungs:  Clear bilaterally to auscultation and percussion. Abdomen:  Bowel sounds positive; abdomen soft and non-tender without masses, organomegaly, or hernias noted. No hepatosplenomegaly. Msk:  Back normal, normal gait. Muscle strength and tone normal. Extremities:  No clubbing or cyanosis. Neurologic:  Alert and oriented x 3. Skin:  Intact without lesions or rashes. Cervical Nodes:  no significant adenopathy Psych:  Normal affect.   Detailed Cardiovascular Exam  Neck    Carotids: Carotids full and equal bilaterally without bruits.      Neck Veins: Normal, no JVD.    Heart    Inspection: no deformities or lifts noted.      Palpation: normal PMI with no thrills palpable.      Auscultation: Regular rate and rhythm, S1, S2 without murmurs, rubs, gallops, or clicks.    Vascular    Abdominal Aorta: no palpable masses, pulsations, or audible bruits.      Femoral Pulses: normal femoral pulses bilaterally.      Pedal Pulses: normal pedal pulses bilaterally.      Radial Pulses: normal radial pulses bilaterally.      Peripheral Circulation: no clubbing, cyanosis, or edema noted with normal capillary refill.     Impression & Recommendations:  Problem # 1:  CARDIOMYOPATHY (ICD-425.4)  I suspect his ejection fraction is improved between medical management, treatment of his thyroid and cardioversion. I will check an echocardiogram. Orders: Echocardiogram (Echo)  Problem # 2:  HYPERTENSION (ICD-401.9) His blood pressure is controlled with the meds as above.  Problem # 3:  TOBACCO ABUSE (ICD-305.1) He is to completely abstain from cigarettes.  Problem # 4:  ATRIAL FIBRILLATION (ICD-427.31) He seems to be maintaining sinus rhythm. I will have him followup with his endocrinologist. I  will check an echocardiogram. If his LV function is normal, he has no symptomatic recurrence of fibrillation and his thyroid is corrected he'll be able to come off of his Coumadin at the next visit. Orders: EKG w/ Interpretation (93000)  Problem # 5:  HEPATITIS C (ICD-070.51) He says he cannot take hepatitis C therapies until he is off of his medicines for hyperthyroidism. I will send this note to Dr. Everardo All and ask for follow up.  Patient Instructions: 1)  Your physician recommends that you schedule a follow-up appointment in: 6 months with Dr Antoine Poche 2)  Your physician recommends that you continue on your current medications as directed. Please refer to the Current Medication list given to you today. 3)  Your physician has requested that you have an echocardiogram.  Echocardiography is a painless test that uses sound waves to create images of your heart. It provides your doctor with information about the size and shape of your heart and how well your heart's chambers and valves are working.  This procedure takes approximately one hour. There are no restrictions for this procedure. 4)  You have been diagnosed with atrial fibrillation.  Atrial fibrillation is a condition in which one of the upper chambers of the heart has extra electrical cells causing it to beat very fast.  Please see the handout/brochure given to you today for further information. 5)  You have been diagnosed with Congestive Heart Failure or CHF.  CHF is a condition in which a problem with the structure or function of the heart impairs its ability to supply sufficient blood flow to meet the body's needs.  For further information please visit www.cardiosmart.org for detailed information on CHF. 6)  Your physician recommends that you weigh, daily, at the same time every day, and in the same amount of clothing.  Please record your daily weights on the handout provided and bring it to your next appointment.

## 2010-08-23 NOTE — Medication Information (Signed)
Summary: rov/tm  Anticoagulant Therapy  Managed by: Weston Brass, PharmD PCP: Earl Lites, MD Supervising MD: Eden Emms MD, Theron Arista Indication 1: Atrial Fibrillation (ICD-427.31) Indication 2: DCCV Pending (08/17/2009) see weekly Lab Used: [facility] Site: [facility] INR POC 1.5 INR RANGE 2 - 3  Dietary changes: no    Health status changes: no    Bleeding/hemorrhagic complications: no    Recent/future hospitalizations: no    Any changes in medication regimen? no    Recent/future dental: no  Any missed doses?: yes     Details: missed 1 dose on Saturday    Allergies: No Known Drug Allergies  Anticoagulation Management History:      The patient is taking warfarin and comes in today for a routine follow up visit.  Negative risk factors for bleeding include an age less than 55 years old.  The bleeding index is 'low risk'.  Positive CHADS2 values include History of HTN.  Negative CHADS2 values include Age > 20 years old.  The start date was 07/27/2008.  His last INR was 2.54.  Anticoagulation responsible provider: Eden Emms MD, Theron Arista.  INR POC: 1.5.  Cuvette Lot#: [id number].  Exp: 04/2011.    Anticoagulation Management Assessment/Plan:      The patient's current anticoagulation dose is Coumadin 5 mg tabs: Take as directed by coumadin clinic..  The target INR is 2 - 3.  The next INR is due 02/21/2010.  Anticoagulation instructions were given to patient.  Results were reviewed/authorized by Weston Brass, PharmD.  He was notified by Weston Brass PharmD.         Prior Anticoagulation Instructions: INR 1.8 Today take 12.5mg s then resume 10mg s everyday except 12.5mg s on Tuesdays and Thursdays. Recheck in 4 weeks.   Current Anticoagulation Instructions: INR 1.5  Take extra 1 tablet today then increase dose to 2 tablets every day except 2 1/2 tablets on Tuesday, Thursday and Saturday.

## 2010-08-23 NOTE — Medication Information (Signed)
Summary: rov/sp  Anticoagulant Therapy  Managed by: Bethena Midget, RN, BSN PCP: Earl Lites, MD Supervising MD: Gala Romney MD, Reuel Boom Indication 1: Atrial Fibrillation (ICD-427.31) Indication 2: DCCV Pending (08/17/2009) see weekly Lab Used: [facility] Site: [facility] INR POC 2.0 INR RANGE 2 - 3  Dietary changes: no    Health status changes: no    Bleeding/hemorrhagic complications: no    Recent/future hospitalizations: no    Any changes in medication regimen? no    Recent/future dental: no  Any missed doses?: no       Is patient compliant with meds? yes       Allergies: No Known Drug Allergies  Anticoagulation Management History:      The patient is taking warfarin and comes in today for a routine follow up visit.  Negative risk factors for bleeding include an age less than 62 years old.  The bleeding index is 'low risk'.  Positive CHADS2 values include History of HTN.  Negative CHADS2 values include Age > 16 years old.  The start date was 07/27/2008.  His last INR was 2.54.  Anticoagulation responsible provider: Tristyn Demarest MD, Reuel Boom.  INR POC: 2.0.  Cuvette Lot#: [id number].  Exp: 04/2011.    Anticoagulation Management Assessment/Plan:      The patient's current anticoagulation dose is Coumadin 5 mg tabs: Take as directed by coumadin clinic..  The target INR is 2 - 3.  The next INR is due 03/14/2010.  Anticoagulation instructions were given to patient.  Results were reviewed/authorized by Bethena Midget, RN, BSN.  He was notified by Bethena Midget, RN, BSN.         Prior Anticoagulation Instructions: INR 1.5  Take extra 1 tablet today then increase dose to 2 tablets every day except 2 1/2 tablets on Tuesday, Thursday and Saturday.    Current Anticoagulation Instructions: INR 2.0 Continue 2 pills everyday except 2.5 pills on Tuesdays, Thursdays and Saturdays. Recheck in 3 weeks.

## 2010-08-23 NOTE — Progress Notes (Signed)
Summary: pt do not want this appt , not ready to do  this  Phone Note Call from Patient   Caller: Patient Summary of Call: FYI Dr Lorane Gell  Appt scheduled for 05-04-2010@8 :00 am  wed for i-131 RX pt called informed pt of this appt states he is not ready to do this will call and cx his appt    Initial call taken by: Shelbie Proctor,  April 21, 2010 4:07 PM  Follow-up for Phone Call        i called pt 04/21/10.  pt says he will go ahead and do the rx as scheduled.  please ret here 1-2 weeks later. Follow-up by: Minus Breeding MD,  April 21, 2010 7:07 PM

## 2010-08-23 NOTE — Medication Information (Signed)
Summary: rov/eac  Anticoagulant Therapy  Managed by: Elaina Pattee, PharmD PCP: Earl Lites, MD Supervising MD: Daleen Squibb MD, Maisie Fus Indication 1: Atrial Fibrillation (ICD-427.31) Indication 2: DCCV Pending (08/17/2009) see weekly Lab Used: [facility] Site: [facility] INR POC 3.0 INR RANGE 2 - 3  Dietary changes: no    Health status changes: no    Bleeding/hemorrhagic complications: no    Recent/future hospitalizations: yes       Details: Pending DCCV. This will be 4th visit.  Any changes in medication regimen? no    Recent/future dental: no  Any missed doses?: no       Is patient compliant with meds? yes       Allergies: No Known Drug Allergies  Anticoagulation Management History:      The patient is taking warfarin and comes in today for a routine follow up visit.  Negative risk factors for bleeding include an age less than 88 years old.  The bleeding index is 'low risk'.  Positive CHADS2 values include History of HTN.  Negative CHADS2 values include Age > 54 years old.  The start date was 07/27/2008.  Anticoagulation responsible provider: Daleen Squibb MD, Maisie Fus.  INR POC: 3.0.  Cuvette Lot#: [id number].  Exp: 11/2010.    Anticoagulation Management Assessment/Plan:      The patient's current anticoagulation dose is Coumadin 5 mg tabs: Take as directed by coumadin clinic..  The target INR is 2 - 3.  The next INR is due 11/05/2009.  Anticoagulation instructions were given to patient.  Results were reviewed/authorized by Elaina Pattee, PharmD.  He was notified by Elaina Pattee, PharmD.         Prior Anticoagulation Instructions: INR 2.5  Continue taking 2.5 tablets on Tuesday and Thursday and 2 tablets all other days.  Return to clinic in 1 week.    Current Anticoagulation Instructions: INR 3.0. Take 2 tablets daily except 2.5 tablets Tues and Thurs.

## 2010-08-23 NOTE — Procedures (Signed)
Summary: Colonoscopy  Patient: [name] Note: All result statuses are Final unless otherwise noted.  Tests: (1) Colonoscopy (COL)   COL Colonoscopy           DONE     [facility]     [address]     [place], [place]  [address]           COLONOSCOPY PROCEDURE REPORT           PATIENT:  Donald Myers, Donald Myers  MR#:  [id number]     BIRTHDATE:  1955/11/16, 53 yrs. old  GENDER:  male           ENDOSCOPIST:  Barbette Hair. Arlyce Dice, MD     Referred by:  Cleophas Dunker Everardo All, M.D.           PROCEDURE DATE:  08/02/2009     PROCEDURE:  Colonoscopy with snare polypectomy     ASA CLASS:  Class II     INDICATIONS:  Routine Risk Screening           MEDICATIONS:   Fentanyl 75 mcg IV, Versed 8 mg IV           DESCRIPTION OF PROCEDURE:   After the risks benefits and     alternatives of the procedure were thoroughly explained, informed     consent was obtained.  Digital rectal exam was performed and     revealed no abnormalities.   The LB CF-[id number] endoscope     was introduced through the anus and advanced to the cecum, which     was identified by the ileocecal valve, without limitations.  The     quality of the prep was good, using MoviPrep.  The instrument was     then slowly withdrawn as the colon was fully examined.     <<PROCEDUREIMAGES>>           FINDINGS:  There were multiple polyps identified and removed. at     the splenic flexure (see image11, image14, and image12). 3 2mm     polyps, removed with cold polypectomy snare  A sessile polyp was     found in the sigmoid colon. It was 2 mm in size. It was found 40     cm from the point of entry. Polyp was snared without cautery.     Retrieval was successful (see image19). snare polyp  Severe     diverticulosis was found in the sigmoid colon (see image21 and     image1).  This was otherwise a normal examination of the colon     (see image2, image5, image6, image7, image10, image15, image16,     image18, image25, image26,  and image27).   Retroflexed views in     the rectum revealed no abnormalities.    The scope was then     withdrawn from the patient and the procedure completed.           COMPLICATIONS:  None           ENDOSCOPIC IMPRESSION:     1) Polyps, multiple at the splenic flexure     2) 2 mm sessile polyp in the sigmoid colon     3) Severe diverticulosis in the sigmoid colon     4) Otherwise normal examination     RECOMMENDATIONS:     1) If the polyp(s) removed today are proven to be adenomatous     (pre-cancerous) polyps, you will need a repeat colonoscopy in 5  years. Otherwise you should continue to follow colorectal cancer     screening guidelines for "routine risk" patients with colonoscopy     in 10 years.     2) resume coumadin in am           REPEAT EXAM:   You will receive a letter from Dr. Arlyce Dice in 1-2     weeks, after reviewing the final pathology, with followup     recommendations.           ______________________________     Barbette Hair Arlyce Dice, MD           CC:           n.     eSIGNED:   Barbette Hair. Tiarah Shisler at 08/02/2009 10:56 AM           Page 2 of 3   [name], [phone]  Note: An exclamation mark (!) indicates a result that was not dispersed into the flowsheet. Document Creation Date: 08/02/2009 10:56 AM _______________________________________________________________________  (1) Order result status: Final Collection or observation date-time: 08/02/2009 10:49 Requested date-time:  Receipt date-time:  Reported date-time:  Referring Physician:   Ordering Physician: Melvia Heaps [phone]) Specimen Source:  Source: Launa Grill Order Number: [id number] Lab site:   Appended Document: Colonoscopy     Procedures Next Due Date:    Colonoscopy: 07/2014

## 2010-08-23 NOTE — Medication Information (Signed)
Summary: rov/ewj  Anticoagulant Therapy  Managed by: Cloyde Reams, RN, BSN PCP: Earl Lites, MD Supervising MD: Daleen Squibb MD, Maisie Fus Indication 1: Atrial Fibrillation (ICD-427.31) Indication 2: DCCV Pending (08/17/2009) see weekly Lab Used: [facility] Site: [facility] INR POC 1.8 INR RANGE 2 - 3  Dietary changes: no    Health status changes: no    Bleeding/hemorrhagic complications: no    Recent/future hospitalizations: no    Any changes in medication regimen? no    Recent/future dental: no  Any missed doses?: no       Is patient compliant with meds? yes       Allergies (verified): No Known Drug Allergies  Anticoagulation Management History:      The patient is taking warfarin and comes in today for a routine follow up visit.  Negative risk factors for bleeding include an age less than 40 years old.  The bleeding index is 'low risk'.  Positive CHADS2 values include History of HTN.  Negative CHADS2 values include Age > 40 years old.  The start date was 07/27/2008.  Anticoagulation responsible provider: Daleen Squibb MD, Maisie Fus.  INR POC: 1.8.  Cuvette Lot#: [id number].  Exp: 11/2010.    Anticoagulation Management Assessment/Plan:      The patient's current anticoagulation dose is Coumadin 5 mg tabs: Take as directed by coumadin clinic..  The target INR is 2 - 3.  The next INR is due 10/07/2009.  Anticoagulation instructions were given to patient.  Results were reviewed/authorized by Cloyde Reams, RN, BSN.  He was notified by Cloyde Reams RN.         Prior Anticoagulation Instructions: INR 1.8  Take 2 tablets today then start taking 2 tablets daily except 1.5 tablets on Sundays, Tuesdays, and Thursdays.  Recheck in 1 week.  Pending cardioversion.  Current Anticoagulation Instructions: INR 1.8  Take 2 tablets daily.  Recheck in 1 week.

## 2010-08-23 NOTE — Miscellaneous (Signed)
Summary: Orders Update  Clinical Lists Changes  Orders: Added new Test order of T-Protime, Auto ([id number]) - Signed 

## 2010-08-23 NOTE — Medication Information (Signed)
Summary: rov/eac  Anticoagulant Therapy  Managed by: Eda Keys, PharmD PCP: Earl Lites, MD Supervising MD: Ladona Ridgel MD, Sharlot Gowda Indication 1: Atrial Fibrillation (ICD-427.31) Indication 2: DCCV Pending (08/17/2009) see weekly Lab Used: [facility] Site: [facility] INR POC 2.5 INR RANGE 2 - 3  Dietary changes: no    Health status changes: no    Bleeding/hemorrhagic complications: no    Recent/future hospitalizations: no    Any changes in medication regimen? no    Recent/future dental: no  Any missed doses?: no       Is patient compliant with meds? yes       Allergies: No Known Drug Allergies  Anticoagulation Management History:      The patient is taking warfarin and comes in today for a routine follow up visit.  Negative risk factors for bleeding include an age less than 68 years old.  The bleeding index is 'low risk'.  Positive CHADS2 values include History of HTN.  Negative CHADS2 values include Age > 60 years old.  The start date was 07/27/2008.  Anticoagulation responsible provider: Ladona Ridgel MD, Sharlot Gowda.  INR POC: 2.5.  Cuvette Lot#: [id number].  Exp: 11/2010.    Anticoagulation Management Assessment/Plan:      The patient's current anticoagulation dose is Coumadin 5 mg tabs: Take as directed by coumadin clinic..  The target INR is 2 - 3.  The next INR is due 10/28/2009.  Anticoagulation instructions were given to patient.  Results were reviewed/authorized by Eda Keys, PharmD.  He was notified by Eda Keys.         Prior Anticoagulation Instructions: INR 2.4  Continue taking 2.5 tablets on Tuesday and Thursday, and take 2 tablets all other days.  Return to clinic in 1 week.    Current Anticoagulation Instructions: INR 2.5  Continue taking 2.5 tablets on Tuesday and Thursday and 2 tablets all other days.  Return to clinic in 1 week.

## 2010-08-23 NOTE — Progress Notes (Signed)
----   Converted from flag ---- ---- 08/16/2009 12:56 PM, Charolotte Capuchin, RN wrote: Pt pending cardioversion after 4 pt/inr's. ------------------------------       Additional Follow-up for Phone Call Additional follow up Details #2::    Spoke with pt and scheduled him for this week for INR due to his pending DCCV.

## 2010-08-23 NOTE — Medication Information (Signed)
Summary: rov/eac  Anticoagulant Therapy  Managed by: Eda Keys, PharmD PCP: Earl Lites, MD Supervising MD: Daleen Squibb MD, Maisie Fus Indication 1: Atrial Fibrillation (ICD-427.31) Indication 2: DCCV Pending (08/17/2009) see weekly Lab Used: [facility] Site: [facility] INR RANGE 2 - 3  Dietary changes: no    Health status changes: no    Bleeding/hemorrhagic complications: no    Recent/future hospitalizations: no    Any changes in medication regimen? no    Recent/future dental: no  Any missed doses?: no       Is patient compliant with meds? yes      Comments: Pending DCCV  Allergies: No Known Drug Allergies  Anticoagulation Management History:      The patient is taking warfarin and comes in today for a routine follow up visit.  Negative risk factors for bleeding include an age less than 64 years old.  The bleeding index is 'low risk'.  Positive CHADS2 values include History of HTN.  Negative CHADS2 values include Age > 86 years old.  The start date was 07/27/2008.  Anticoagulation responsible provider: Daleen Squibb MD, Maisie Fus.  Cuvette Lot#: [id number].  Exp: 11/2010.    Anticoagulation Management Assessment/Plan:      The patient's current anticoagulation dose is Coumadin 5 mg tabs: Take as directed by coumadin clinic..  The target INR is 2 - 3.  The next INR is due 10/21/2009.  Anticoagulation instructions were given to patient.  Results were reviewed/authorized by Eda Keys, PharmD.  He was notified by Eda Keys.         Prior Anticoagulation Instructions: INR 2.0  Start NEW dosing schedule of 2.5 tablets on Tuesday and Thursday and 2 tablets all other days.  Return to clinic in 1 week.    Current Anticoagulation Instructions: INR 2.4  Continue taking 2.5 tablets on Tuesday and Thursday, and take 2 tablets all other days.  Return to clinic in 1 week.

## 2010-08-23 NOTE — Medication Information (Signed)
Summary: rov/ewj  Anticoagulant Therapy  Managed by: Eda Keys, PharmD PCP: Earl Lites, MD Supervising MD: Myrtis Ser MD, Tinnie Gens Indication 1: Atrial Fibrillation (ICD-427.31) Indication 2: DCCV Pending (08/17/2009) see weekly Lab Used: [facility] Site: [facility] INR POC 2.3 INR RANGE 2 - 3  Dietary changes: no    Health status changes: no    Bleeding/hemorrhagic complications: no    Recent/future hospitalizations: no    Any changes in medication regimen? no    Recent/future dental: no  Any missed doses?: no       Is patient compliant with meds? yes       Allergies: No Known Drug Allergies  Anticoagulation Management History:      The patient is taking warfarin and comes in today for a routine follow up visit.  Negative risk factors for bleeding include an age less than 22 years old.  The bleeding index is 'low risk'.  Positive CHADS2 values include History of HTN.  Negative CHADS2 values include Age > 75 years old.  The start date was 07/27/2008.  His last INR was 2.54.  Anticoagulation responsible provider: Myrtis Ser MD, Tinnie Gens.  INR POC: 2.3.  Cuvette Lot#: [id number].  Exp: 02/2011.    Anticoagulation Management Assessment/Plan:      The patient's current anticoagulation dose is Coumadin 5 mg tabs: Take as directed by coumadin clinic..  The target INR is 2 - 3.  The next INR is due 01/10/2010.  Anticoagulation instructions were given to patient.  Results were reviewed/authorized by Eda Keys, PharmD.  He was notified by Eda Keys.         Prior Anticoagulation Instructions: INR 2.1  Continue on same dosage 2 tablets daily except 2.5 tablets on Tuesdays and Thursdays.  Recheck in 4 weeks.    Current Anticoagulation Instructions: INR 2.3  Continue taking 2.5 tablets on Tuesday and Thursday and 2 tablets all other days.  Return to clinic in 4 weeks.

## 2010-08-23 NOTE — Medication Information (Signed)
Summary: rov/sp   Anticoagulant Therapy  Managed by: Reina Fuse, PharmD PCP: Earl Lites, MD Supervising MD: Gala Romney MD, Reuel Boom Indication 1: Atrial Fibrillation (ICD-427.31) Indication 2: DCCV Pending (08/17/2009) see weekly Lab Used: [facility] Site: [facility] INR POC 2.9 INR RANGE 2 - 3  Dietary changes: no    Health status changes: no    Bleeding/hemorrhagic complications: no    Recent/future hospitalizations: no    Any changes in medication regimen? no    Recent/future dental: no  Any missed doses?: no       Is patient compliant with meds? yes       Allergies: No Known Drug Allergies  Anticoagulation Management History:      The patient is taking warfarin and comes in today for a routine follow up visit.  Negative risk factors for bleeding include an age less than 65 years old.  The bleeding index is 'low risk'.  Positive CHADS2 values include History of HTN.  Negative CHADS2 values include Age > 31 years old.  The start date was 07/27/2008.  His last INR was 2.54.  Anticoagulation responsible provider: Bensimhon MD, Reuel Boom.  INR POC: 2.9.  Cuvette Lot#: [id number].  Exp: 04/2011.    Anticoagulation Management Assessment/Plan:      The patient's current anticoagulation dose is Coumadin 5 mg tabs: Take as directed by coumadin clinic..  The target INR is 2 - 3.  The next INR is due 04/11/2010.  Anticoagulation instructions were given to patient.  Results were reviewed/authorized by Reina Fuse, PharmD.  He was notified by Reina Fuse PharmD.         Prior Anticoagulation Instructions: INR 2.0 Continue 2 pills everyday except 2.5 pills on Tuesdays, Thursdays and Saturdays. Recheck in 3 weeks.   Current Anticoagulation Instructions: INR 2.9  Continue taking Coumadin 2 tabs (10 mg) on Sun, Mon, Wed, Fri and Coumadin 2.5 tabs (12.5 mg) on Tues, Thur, Sat. Return to clinic in 4 weeks.

## 2010-08-23 NOTE — Assessment & Plan Note (Signed)
Summary: appt @ 11:45/cardiomyopathy  Medications Added CHANTIX STARTING MONTH PAK 0.5 MG X 11 & 1 MG X 42 TABS (VARENICLINE TARTRATE) as directed CHANTIX CONTINUING MONTH PAK 1 MG TABS (VARENICLINE TARTRATE) as directed      Allergies Added: NKDA  Visit Type:  Follow-up Primary Provider:  Earl Lites, MD  CC:  Atrial Fibrillation.  History of Present Illness: The patient presents for followup. Since I last saw him he had to come off of his Coumadin for polypectomy. He is now back on it. He has not yet had cardioversion for his atrial fibrillation. He does not notice this rhythm. He has had no palpitations, presyncope or syncope. He does not have any shortness of breath and denies any PND or orthopnea. He denies any chest discomfort, neck or arm discomfort. I do note that he is no longer hyperthyroid and in fact his TSH was elevated and he recently had his dose of propylthiouracil reduced.  Current Medications (verified): 1)  Metoprolol Succinate 200 Mg Xr24h-Tab (Metoprolol Succinate) .... Take 1 By Mouth Qd 2)  Propylthiouracil 50 Mg Tabs (Propylthiouracil) .... Take 4 Tabs Bid 3)  Diltiazem Hcl Cr 180 Mg Xr24h-Cap (Diltiazem Hcl) .... Take 1 By Mouth Qd 4)  Lisinopril 5 Mg Tabs (Lisinopril) .... Take One Tablet By Mouth Daily 5)  Coumadin 5 Mg Tabs (Warfarin Sodium) .... Take As Directed By Coumadin Clinic.  Allergies (verified): No Known Drug Allergies  Past History:  Past Medical History: Reviewed history from 09/26/2008 and no changes required. 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 Inguinal hernia repair  Review of Systems       As stated in the HPI and negative for all other systems.   Vital Signs:  Patient profile:   55 year old male Height:      73 inches Weight:      178  pounds BMI:     23.57 Pulse rate:   67 / minute Resp:     16 per minute BP sitting:   102 / 72  (right arm)  Vitals Entered By: Marrion Coy, CNA (August 16, 2009 12:05 PM)  Physical Exam  General:  Well developed, well nourished, in no acute distress. Head:  normocephalic and atraumatic Eyes:  PERRLA/EOM intact; conjunctiva and lids normal. Mouth:  Poor dentition, gums and palate normal. Oral mucosa normal. Neck:  Neck supple, no JVD. No masses, thyromegaly or abnormal cervical nodes. Lungs:  Clear bilaterally to auscultation and percussion. Abdomen:  Bowel sounds positive; abdomen soft and non-tender without masses, organomegaly, or hernias noted. No hepatosplenomegaly. Msk:  Back normal, normal gait. Muscle strength and tone normal. Extremities:  No clubbing or cyanosis. Neurologic:  Alert and oriented x 3. Skin:  Intact without lesions or rashes. Psych:  Normal affect.   Detailed Cardiovascular Exam  Neck    Carotids: Carotids full and equal bilaterally without bruits.      Neck Veins: Normal, no JVD.    Heart    Inspection: no deformities or lifts noted.      Palpation: normal PMI with no thrills palpable.      Auscultation: irregular rate and rhythm, S1, S2 without murmurs, rubs, gallops, or clicks.    Vascular    Abdominal Aorta: no palpable masses, pulsations, or audible bruits.      Femoral Pulses: normal femoral pulses bilaterally.      Pedal  Pulses: normal pedal pulses bilaterally.      Radial Pulses: normal radial pulses bilaterally.      Peripheral Circulation: no clubbing, cyanosis, or edema noted with normal capillary refill.     EKG  Procedure date:  08/16/2009  Findings:      atrial fibrillation, rate 67, axis within normal limits, intervals within normal limits, no acute ST-T wave changes.  Impression & Recommendations:  Problem # 1:  HYPERTHYROIDISM (ICD-242.90) He is no longer hyperthyroid. I reviewed recent labs. Therefore, this is not an  impediment to cardioversion.  Problem # 2:  ATRIAL FIBRILLATION (ICD-427.31) He has now been off the Coumadin for a polypectomy. He has resumed. He will need 3 weeks of a therapeutic INR and then I will plan elective cardioversion. Orders: EKG w/ Interpretation (93000)  Problem # 3:  TOBACCO ABUSE (ICD-305.1) We discussed the need to stop smoking. (Greater than 3 minutes.) He was given a prescription for Chantix. We discussed all the risks including suicidal ideation. He has no contraindication.  Problem # 4:  CARDIOMYOPATHY (ICD-425.4) We will reassess his EF after he has been converted to sinus rhythm.  Patient Instructions: 1)  Your physician recommends that you schedule a follow-up appointment in: 6 months or at time of cardioversion 2)  Your physician has recommended you make the following change in your medication: start Chantix as directed 3)  You have been diagnosed with atrial fibrillation.  Atrial fibrillation is a condition in which one of the upper chambers of the heart has extra electrical cells causing it to beat very fast.  Please see the handout/brochure given to you today for further information. 4)  Your physician has recommended that you have a cardioversion (DCCV).  Electrical cardioversion uses a jolt of electricity to your heart either through paddles or wired patches attached to your chest. This is a controlled, usually prescheduled, procedure. Defibrillation is done under light anesthesia in the hospital, and you usually go home the day of the procedure. This is done to get your heart back into a normal rhythm. You are not awake for the procedure.  This will be scheduled at a later date 5)  Your physician recommends that you weigh, daily, at the same time every day, and in the same amount of clothing.  Please record your daily weights on the handout provided and bring it to your next appointment. Prescriptions: CHANTIX CONTINUING MONTH PAK 1 MG TABS (VARENICLINE TARTRATE)  as directed  #1 x 5   Entered by:   Charolotte Capuchin, RN   Authorized by:   Rollene Rotunda, MD, [facility]   Signed by:   Charolotte Capuchin, RN on 08/16/2009   Method used:   [facility]        [facility] [address] [phone]* (retail)       [address]       [place], [place]  [address]       Ph: [phone] or [phone]       Fax: [phone]   RxID:   [id number] CHANTIX STARTING MONTH PAK 0.5 MG X 11 & 1 MG X 42 TABS (VARENICLINE TARTRATE) as directed  #1 x 0   Entered by:   Charolotte Capuchin, RN   Authorized by:   Rollene Rotunda, MD, [facility]   Signed by:   Charolotte Capuchin, RN on 08/16/2009   Method used:   [facility]        [facility]  [id number] (retail)       [address]       [place], [place]  [address]       Ph: [phone] or [phone]       Fax: [phone]   RxID:   [id number]

## 2010-08-23 NOTE — Medication Information (Signed)
Summary: rov/sl  Anticoagulant Therapy  Managed by: Weston Brass, PharmD PCP: Earl Lites, MD Supervising MD: Tenny Craw MD, Gunnar Fusi Indication 1: Atrial Fibrillation (ICD-427.31) Indication 2: DCCV Pending (08/17/2009) see weekly Lab Used: [facility] Site: [facility] INR POC 2.3 INR RANGE 2 - 3  Dietary changes: no    Health status changes: yes       Details: having thyroid scan next week   Bleeding/hemorrhagic complications: no    Recent/future hospitalizations: no    Any changes in medication regimen? no    Recent/future dental: no  Any missed doses?: yes     Details: missed 1 dose on Saturday  Is patient compliant with meds? yes       Allergies: No Known Drug Allergies  Anticoagulation Management History:      The patient is taking warfarin and comes in today for a routine follow up visit.  Negative risk factors for bleeding include an age less than 46 years old.  The bleeding index is 'low risk'.  Positive CHADS2 values include History of HTN.  Negative CHADS2 values include Age > 29 years old.  The start date was 07/27/2008.  His last INR was 2.54.  Anticoagulation responsible provider: Tenny Craw MD, Gunnar Fusi.  INR POC: 2.3.  Cuvette Lot#: [id number].  Exp: 04/2011.    Anticoagulation Management Assessment/Plan:      The patient's current anticoagulation dose is Coumadin 5 mg tabs: Take as directed by coumadin clinic..  The target INR is 2 - 3.  The next INR is due 05/09/2010.  Anticoagulation instructions were given to patient.  Results were reviewed/authorized by Weston Brass, PharmD.  He was notified by Weston Brass PharmD.         Prior Anticoagulation Instructions: INR 2.9  Continue taking Coumadin 2 tabs (10 mg) on Sun, Mon, Wed, Fri and Coumadin 2.5 tabs (12.5 mg) on Tues, Thur, Sat. Return to clinic in 4 weeks.   Current Anticoagulation Instructions: INR 2.3  Continue same dose of 2 tablets every day except 2 1/2 tablets on Tuesday, Thursday and Saturday.  Recheck INR in 4  weeks.

## 2010-08-25 NOTE — Miscellaneous (Signed)
  Clinical Lists Changes  Observations: Added new observation of ECHOINTERP:   - Left ventricle: The cavity size was normal. Wall thickness was       increased in a pattern of mild LVH. Systolic function was normal.       The estimated ejection fraction was in the range of 55% to 60%.       Wall motion was normal; there were no regional wall motion       abnormalities.     - Aortic valve: Trivial regurgitation.     - Mitral valve: Mild regurgitation.     - Left atrium: The atrium was mildly dilated.     - Right ventricle: The cavity size was mildly dilated.     - Right atrium: The atrium was mildly dilated.     - Atrial septum: There was an atrial septal aneurysm.     - Pulmonary arteries: Systolic pressure was mildly increased. PA       peak pressure: 32mm Hg (S). (02/14/2010 9:24)      Echocardiogram  Procedure date:  02/14/2010  Findings:        - Left ventricle: The cavity size was normal. Wall thickness was       increased in a pattern of mild LVH. Systolic function was normal.       The estimated ejection fraction was in the range of 55% to 60%.       Wall motion was normal; there were no regional wall motion       abnormalities.     - Aortic valve: Trivial regurgitation.     - Mitral valve: Mild regurgitation.     - Left atrium: The atrium was mildly dilated.     - Right ventricle: The cavity size was mildly dilated.     - Right atrium: The atrium was mildly dilated.     - Atrial septum: There was an atrial septal aneurysm.     - Pulmonary arteries: Systolic pressure was mildly increased. PA       peak pressure: 32mm Hg (S).

## 2010-08-25 NOTE — Assessment & Plan Note (Signed)
Summary: 1 MTH FU  STC   Vital Signs:  Patient profile:   55 year old male Height:      73 inches (185.42 cm) Weight:      179.50 pounds (81.59 kg) BMI:     23.77 O2 Sat:      99 % on Room air Temp:     97.0 degrees F (36.11 degrees C) oral Pulse rate:   41 / minute BP sitting:   110 / 70  (left arm) Cuff size:   regular  Vitals Entered By: Brenton Grills CMA Duncan Dull) (July 04, 2010 8:05 AM)  O2 Flow:  Room air CC: 1 month F/U/aj Is Patient Diabetic? No   Referring Provider:  Earl Lites, MD Primary Provider:  Earl Lites, MD  CC:  1 month F/U/aj.  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  pt states he feels well in general. atrial fibrillation: because of this (and therefore the need for prompt improvement in his tft), he was placed back on tapazole.  he does not notice palpitations. he was noted by dr hochrein to have bradycardia at last ov there, but it is slower now  Current Medications (verified): 1)  Metoprolol Succinate 200 Mg Xr24h-Tab (Metoprolol Succinate) .... Take 1/2 Tablet Daily 2)  Lisinopril 5 Mg Tabs (Lisinopril) .... Take One Tablet By Mouth Daily 3)  Coumadin 5 Mg Tabs (Warfarin Sodium) .... Take As Directed By Coumadin Clinic. 4)  Methimazole 10 Mg Tabs (Methimazole) .... 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    Physical Exam  General:  normal appearance.   Neck:  there is little if any thyroid enlargement   Heart:  bradycardic rate and reg rhythm without murmurs or gallops noted. Normal S1,S2.     Additional Exam:  FastTSH              [H]  23.41 uIU/mL                0.35-5.50 Free T4              [L]  0.23 ng/dL                  0.60-1.60  (i discused the case with dr hochrein)    Impression & Recommendations:  Problem # 1:  HYPERTHYROIDISM (ICD-242.90) Assessment Improved goiter is also much smaller  Problem # 2:  ATRIAL FIBRILLATION (ICD-427.31) this necessitated the tapazole, while the i-131 was working  Problem # 3:  bradycardia this is due to the fact that rx of #1 has reduced his need for the b-blocker.  Medications Added to Medication List This Visit: 1)  Levothyroxine Sodium 75 Mcg Tabs (Levothyroxine sodium) .Marland Kitchen.. 1 tab once daily 2)  Toprol Xl 50 Mg Xr24h-tab (Metoprolol succinate) .Marland Kitchen.. 1 tab once daily  Other Orders: TLB-TSH (Thyroid Stimulating Hormone) (84443-TSH) TLB-T4 (Thyrox), Free [phone]) Est. Patient Level IV (86578)  Patient Instructions: 1)  blood tests are being ordered for you today.  please call [phone] to hear your test results. 2)  pending the test results, please stop the methimazole 3)  Please  schedule a follow-up appointment in 1 month. 4)  (update: i left message on phone-tree:  start synthroid 75 micrograms/day.  go to lab for tsh and free t4 in 2 weeks 244.1.  reduce toprol to 50 mg qd). Prescriptions: TOPROL XL 50 MG XR24H-TAB (METOPROLOL SUCCINATE) 1 tab once daily  #30 x 2   Entered and Authorized by:   Minus Breeding MD   Signed by:   Minus Breeding MD on 07/04/2010   Method used:   [facility]        [facility] [address] [phone]* (retail)       [address]       [place], [place]  [address]       Ph: [phone] or [phone]       Fax: [phone]   RxID:   [id number] LEVOTHYROXINE SODIUM 75 MCG TABS (LEVOTHYROXINE SODIUM) 1 tab once daily  #30 x 1   Entered and Authorized by:   Minus Breeding MD   Signed by:   Minus Breeding MD on 07/04/2010   Method used:   [facility]ronically to         [facility] [address] [phone]* (retail)       [address]       [place], [place]  [address]       Ph: [phone] or [phone]       Fax: [phone]   RxID:   [id number]    Orders Added: 1)  TLB-TSH (Thyroid Stimulating Hormone) [84443-TSH] 2)  TLB-T4 (Thyrox), Free [37628-BT5V] 3)  Est. Patient Level IV [76160]

## 2010-08-25 NOTE — Medication Information (Signed)
Summary: rov/tm  Anticoagulant Therapy  Managed by: Bethena Midget, RN, BSN PCP: Earl Lites, MD Supervising MD: Juanda Chance MD, Bruce Indication 1: Atrial Fibrillation (ICD-427.31) Indication 2: DCCV Pending (08/17/2009) see weekly Lab Used: [facility] Site: [facility] INR POC 1.5 INR RANGE 2 - 3  Dietary changes: no    Health status changes: no    Bleeding/hemorrhagic complications: no    Recent/future hospitalizations: no    Any changes in medication regimen? yes       Details: Changed from one thyroid med to Levothyroxine and Metoprolol decreased from 100mg  to 50mg s   Recent/future dental: no  Any missed doses?: no       Is patient compliant with meds? yes       Allergies: No Known Drug Allergies  Anticoagulation Management History:      The patient is taking warfarin and comes in today for a routine follow up visit.  Negative risk factors for bleeding include an age less than 31 years old.  The bleeding index is 'low risk'.  Positive CHADS2 values include History of HTN.  Negative CHADS2 values include Age > 78 years old.  The start date was 07/27/2008.  His last INR was 2.54.  Anticoagulation responsible provider: Juanda Chance MD, Smitty Cords.  INR POC: 1.5.  Cuvette Lot#: [id number].  Exp: 08/2011.    Anticoagulation Management Assessment/Plan:      The patient's current anticoagulation dose is Coumadin 5 mg tabs: Take as directed by coumadin clinic..  The target INR is 2 - 3.  The next INR is due 08/01/2010.  Anticoagulation instructions were given to patient.  Results were reviewed/authorized by Bethena Midget, RN, BSN.  He was notified by Bethena Midget, RN, BSN.         Prior Anticoagulation Instructions: INR 1.2 Today and tomorrow take 15mg s then resume 10mg s daily except 12.5mg s on Tuesdays, Thursdays and Saturdays. Recheck in 10 days.   Current Anticoagulation Instructions: INR 1.5 Today take 3 pills then change dose to 2.5 pills everyday except 2 pills on Mondays, Wednesdays and  Fridays. Recheck in 2 weeks.

## 2010-08-25 NOTE — Medication Information (Signed)
Summary: rov/tm   Anticoagulant Therapy  Managed by: Louann Sjogren, PharmD PCP: Earl Lites, MD Supervising MD: Jens Som MD,Brian Indication 1: Atrial Fibrillation (ICD-427.31) Indication 2: DCCV Pending (08/17/2009) see weekly Lab Used: [facility] Site: [facility] INR POC 1.5 INR RANGE 2 - 3  Dietary changes: no    Health status changes: no    Bleeding/hemorrhagic complications: no    Recent/future hospitalizations: no    Any changes in medication regimen? no    Recent/future dental: no  Any missed doses?: no       Is patient compliant with meds? yes       Allergies: No Known Drug Allergies  Anticoagulation Management History:      The patient is taking warfarin and comes in today for a routine follow up visit.  Negative risk factors for bleeding include an age less than 79 years old.  The bleeding index is 'low risk'.  Positive CHADS2 values include History of HTN.  Negative CHADS2 values include Age > 49 years old.  The start date was 07/27/2008.  His last INR was 2.54 and today's INR is 1.5.  Anticoagulation responsible provider: Jens Som MD,Brian.  INR POC: 1.5.  Cuvette Lot#: [id number].  Exp: 08/2011.    Anticoagulation Management Assessment/Plan:      The patient's current anticoagulation dose is Coumadin 5 mg tabs: Take as directed by coumadin clinic..  The target INR is 2 - 3.  The next INR is due 08/15/2010.  Anticoagulation instructions were given to patient.  Results were reviewed/authorized by Louann Sjogren, PharmD.  He was notified by Louann Sjogren PharmD.         Prior Anticoagulation Instructions: INR 1.5 Today take 3 pills then change dose to 2.5 pills everyday except 2 pills on Mondays, Wednesdays and Fridays. Recheck in 2 weeks.   Current Anticoagulation Instructions: INR 1.5 (goal 2-3)  Today take 3 tablets (08/01/2010).  Then take 2 1/2 tablets daily except take 2 tablets on Mondays and Fridays. Next appointment: Monday, Jan. 23rd at  10:00AM.

## 2010-08-25 NOTE — Medication Information (Signed)
Summary: Donald Myers   Anticoagulant Therapy  Managed by: Windell Hummingbird, RN PCP: Earl Lites, MD Supervising MD: Daleen Squibb MD, Maisie Fus Indication 1: Atrial Fibrillation (ICD-427.31) Indication 2: DCCV Pending (08/17/2009) see weekly Lab Used: [facility] Site: [facility] INR POC 1.7 INR RANGE 2 - 3  Dietary changes: no    Health status changes: no    Bleeding/hemorrhagic complications: no    Recent/future hospitalizations: no    Any changes in medication regimen? no    Recent/future dental: no  Any missed doses?: yes     Details: Missed one dose on Saturday  Is patient compliant with meds? yes       Allergies: No Known Drug Allergies  Anticoagulation Management History:      The patient is taking warfarin and comes in today for a routine follow up visit.  Negative risk factors for bleeding include an age less than 19 years old.  The bleeding index is 'low risk'.  Positive CHADS2 values include History of HTN.  Negative CHADS2 values include Age > 43 years old.  The start date was 07/27/2008.  His last INR was 1.5.  Anticoagulation responsible provider: Daleen Squibb MD, Maisie Fus.  INR POC: 1.7.  Cuvette Lot#: [id number].  Exp: 07/2011.    Anticoagulation Management Assessment/Plan:      The patient's current anticoagulation dose is Coumadin 5 mg tabs: Take as directed by coumadin clinic..  The target INR is 2 - 3.  The next INR is due 08/29/2010.  Anticoagulation instructions were given to patient.  Results were reviewed/authorized by Windell Hummingbird, RN.  He was notified by Windell Hummingbird, RN.         Prior Anticoagulation Instructions: INR 1.5 (goal 2-3)  Today take 3 tablets (08/01/2010).  Then take 2 1/2 tablets daily except take 2 tablets on Mondays and Fridays. Next appointment: Monday, Jan. 23rd at 10:00AM.  Current Anticoagulation Instructions: INR 1.7   Take 1/2 tablet today. Continue 2 tablets on Monday and Friday.  Take 2.5 tablets Sunday, Tuesday, Wednesday, Thursday, and Saturday.   Recheck in two weeks.

## 2010-08-25 NOTE — Medication Information (Signed)
Summary: rov/nb  Anticoagulant Therapy  Managed by: Bethena Midget, RN, BSN PCP: Earl Lites, MD Supervising MD: Jens Som MD, Arlys John Indication 1: Atrial Fibrillation (ICD-427.31) Indication 2: DCCV Pending (08/17/2009) see weekly Lab Used: [facility] Site: [facility] INR POC 1.2 INR RANGE 2 - 3  Dietary changes: no    Health status changes: no    Bleeding/hemorrhagic complications: no    Recent/future hospitalizations: no    Any changes in medication regimen? no    Recent/future dental: no  Any missed doses?: yes     Details: Possible miss dose on Thurs.  and 2.5mg s on Sat.   Is patient compliant with meds? yes       Allergies: No Known Drug Allergies  Anticoagulation Management History:      The patient is taking warfarin and comes in today for a routine follow up visit.  Negative risk factors for bleeding include an age less than 57 years old.  The bleeding index is 'low risk'.  Positive CHADS2 values include History of HTN.  Negative CHADS2 values include Age > 56 years old.  The start date was 07/27/2008.  His last INR was 2.54.  Anticoagulation responsible Johanna Stafford: Jens Som MD, Arlys John.  INR POC: 1.2.  Cuvette Lot#: [id number].  Exp: 08/2011.    Anticoagulation Management Assessment/Plan:      The patient's current anticoagulation dose is Coumadin 5 mg tabs: Take as directed by coumadin clinic..  The target INR is 2 - 3.  The next INR is due 07/14/2010.  Anticoagulation instructions were given to patient.  Results were reviewed/authorized by Bethena Midget, RN, BSN.  He was notified by Bethena Midget, RN, BSN.         Prior Anticoagulation Instructions: INR 2.0 Continue previous dose of 2 tablets everyday except 2.5 tablets on Tuesday, Thursday, and Saturday. Recheck INR in 4 weeks.  Current Anticoagulation Instructions: INR 1.2 Today and tomorrow take 15mg s then resume 10mg s daily except 12.5mg s on Tuesdays, Thursdays and Saturdays. Recheck in 10 days.

## 2010-08-31 NOTE — Assessment & Plan Note (Signed)
Summary: RS BMP -PHONE---D/T--STC   Vital Signs:  Patient profile:   55 year old male Height:      73 inches (185.42 cm) Weight:      180 pounds (81.82 kg) BMI:     23.83 O2 Sat:      94 % on Room air Temp:     99.1 degrees F (37.28 degrees C) oral Pulse rate:   66 / minute BP sitting:   108 / 78  (left arm) Cuff size:   regular  Vitals Entered By: Brenton Grills CMA Duncan Dull) (August 22, 2010 1:07 PM)  O2 Flow:  Room air CC: Follow-up visit/aj Is Patient Diabetic? No   Referring Provider:  Earl Lites, MD Primary Provider:  Earl Lites, MD  CC:  Follow-up visit/aj.  History of Present Illness: hyperthyroidism: pt is 3 1/2 mos s/p i-131 rx for hyperthyroidism due to grave's dz.  pt states he feels well in general, on synthroid 125 micrograms/day.     Current Medications (verified): 1)  Lisinopril 5 Mg Tabs (Lisinopril) .... Take One Tablet By Mouth Daily 2)  Toprol Xl 50 Mg Xr24h-Tab (Metoprolol Succinate) .Marland Kitchen.. 1 Tab Once Daily 3)  Levothyroxine Sodium 125 Mcg Tabs (Levothyroxine Sodium) .Marland Kitchen.. 1 Tab Once Daily  Allergies (verified): No Known Drug Allergies  Past History:  Past Medical History: Last updated: 08/22/2010 Atrial fibrillation COPD Hypertension Hyperthyroidism Cardiomyopathy (EF 35% previous, now NL) Hepatitis C GERD Thrombocytopenia  Review of Systems  The patient denies weight loss and weight gain.    Physical Exam  General:  normal appearance.   Head:  there is bilateral proptosis Neck:  no thyroid enlargement. Neurologic:  no tremor Skin:  not diaphoretic Additional Exam:  FastTSH              [H]  11.96 uIU/mL      Impression & Recommendations:  Problem # 1:  HYPOTHYROIDISM, POST-RADIATION (ICD-244.1) needs increased rx  Medications Added to Medication List This Visit: 1)  Levothyroxine Sodium 150 Mcg Tabs (Levothyroxine sodium) .Marland Kitchen.. 1 tab once daily  Other Orders: TLB-TSH (Thyroid Stimulating Hormone) (84443-TSH) Est. Patient  Level III (78469)  Patient Instructions: 1)  blood tests are being ordered for you today.  please call [phone] to hear your test results. 2)  pending the test results, please continue the same medications for now 3)  Please schedule a follow-up appointment in 6 weeks. 4)  (update: i left message on phone-tree:  increase synthroid to 150 micrograms/day) Prescriptions: LEVOTHYROXINE SODIUM 150 MCG TABS (LEVOTHYROXINE SODIUM) 1 tab once daily  #30 x 1   Entered and Authorized by:   Minus Breeding MD   Signed by:   Minus Breeding MD on 08/22/2010   Method used:   Electronically to        [facility] [address] [phone]* (retail)       [address]       [place], [place]  [address]       Ph: [phone] or [phone]       Fax: [phone]   RxID:   [id number]    Orders Added: 1)  TLB-TSH (Thyroid Stimulating Hormone) [84443-TSH] 2)  Est. Patient Level III [32355]

## 2010-08-31 NOTE — Assessment & Plan Note (Signed)
Summary: 6 MONTH ROV.SL      Allergies Added: NKDA  Visit Type:  Follow-up Primary Provider:  Earl Lites, MD  CC:  Atrial fibrillation and CHF.  History of Present Illness: The patient presents for evaluation of the above. Since I last saw him he has done well from a cardiovascular standpoint. He continues to get active treatment for his thyroid abnormalities and his TSH being quite elevated. He denies any cardiovascular symptoms such as palpitations, presyncope or syncope. He has had no chest pressure, neck or arm discomfort. He has had no shortness of breath, PND or orthopnea. He has had no weight gain or edema. He exercises routinely by bike to work.  Current Medications (verified): 1)  Lisinopril 5 Mg Tabs (Lisinopril) .... Take One Tablet By Mouth Daily 2)  Toprol Xl 50 Mg Xr24h-Tab (Metoprolol Succinate) .Marland Kitchen.. 1 Tab Once Daily 3)  Levothyroxine Sodium 125 Mcg Tabs (Levothyroxine Sodium) .Marland Kitchen.. 1 Tab Once Daily  Allergies (verified): No Known Drug Allergies  Past History:  Past Medical History: Atrial fibrillation COPD Hypertension Hyperthyroidism Cardiomyopathy (EF 35% previous, now NL) Hepatitis C GERD Thrombocytopenia  Past Surgical History: Reviewed history from 09/26/2008 and no changes required. Inguinal hernia repair  Review of Systems       As stated in the HPI and negative for all other systems.   Vital Signs:  Patient profile:   55 year old male Height:      73 inches Weight:      181 pounds BMI:     23.97 Pulse rate:   68 / minute Resp:     16 per minute BP sitting:   124 / 84  (right arm)  Vitals Entered By: Marrion Coy, CNA (August 22, 2010 9:00 AM)  Physical Exam  General:  normal appearance.   Head:  normocephalic and atraumatic Eyes:  PERRLA/EOM intact; conjunctiva and lids normal. Mouth:  Poor dentition, gums and palate normal. Oral mucosa normal. Chest Wall:  no deformities  Lungs:  Clear bilaterally to auscultation and  percussion. Abdomen:  Bowel sounds positive; abdomen soft and non-tender without masses, organomegaly, or hernias noted. No hepatosplenomegaly. Msk:  Back normal, normal gait. Muscle strength and tone normal. Extremities:  No clubbing or cyanosis. Neurologic:  Alert and oriented x 3. Skin:  Intact without lesions or rashes. Cervical Nodes:  no significant adenopathy Axillary Nodes:  no significant adenopathy Inguinal Nodes:  no significant adenopathy Psych:  Normal affect.   Detailed Cardiovascular Exam  Neck    Carotids: Carotids full and equal bilaterally without bruits.      Neck Veins: Normal, no JVD.    Heart    Inspection: no deformities or lifts noted.      Palpation: normal PMI with no thrills palpable.      Auscultation: Regular rate and rhythm, S1, S2 without murmurs, rubs, gallops, or clicks.    Vascular    Abdominal Aorta: no palpable masses, pulsations, or audible bruits.      Femoral Pulses: normal femoral pulses bilaterally.      Pedal Pulses: normal pedal pulses bilaterally.      Radial Pulses: normal radial pulses bilaterally.      Peripheral Circulation: no clubbing, cyanosis, or edema noted with normal capillary refill.     EKG  Procedure date:  08/22/2010  Findings:      Sinus bradycardia rate 59, axis within normal limits interval normal limits, no acute T-wave changes  Impression & Recommendations:  Problem # 1:  ATRIAL FIBRILLATION (  ICD-427.31) The patient has had no recurrent tachyarrhythmias.  He is at low risk for thromboembolic events. Coumadin can be stopped today.  Problem # 2:  CARDIOMYOPATHY (ICD-425.4) His EF is now back to normal. However, I would continue his beta blocker and ACE inhibitor at the current dose.  Problem # 3:  TOBACCO ABUSE (ICD-305.1) He understands the need to abstain completely from all tobacco products.  Problem # 4:  HYPERTENSION (ICD-401.9) His blood pressure is well controlled and he will continue on the current  meds.  Other Orders: EKG w/ Interpretation (93000)  Patient Instructions: 1)  Your physician recommends that you schedule a follow-up appointment in: 1 yr with Dr Antoine Poche 2)  Your physician has recommended you make the following change in your medication: Stop Coumadin

## 2010-09-06 DIAGNOSIS — Z7901 Long term (current) use of anticoagulants: Secondary | ICD-10-CM

## 2010-09-06 DIAGNOSIS — I4891 Unspecified atrial fibrillation: Secondary | ICD-10-CM

## 2010-10-03 ENCOUNTER — Encounter: Payer: Self-pay | Admitting: Endocrinology

## 2010-10-03 ENCOUNTER — Other Ambulatory Visit: Payer: Self-pay | Admitting: Endocrinology

## 2010-10-03 ENCOUNTER — Ambulatory Visit (INDEPENDENT_AMBULATORY_CARE_PROVIDER_SITE_OTHER): Payer: BC Managed Care – PPO | Admitting: Endocrinology

## 2010-10-03 ENCOUNTER — Other Ambulatory Visit: Payer: BC Managed Care – PPO

## 2010-10-03 DIAGNOSIS — E890 Postprocedural hypothyroidism: Secondary | ICD-10-CM

## 2010-10-03 LAB — TSH: TSH: 5.67 u[IU]/mL — ABNORMAL HIGH (ref 0.35–5.50)

## 2010-10-11 LAB — CBC
HCT: 43.4 % (ref 39.0–52.0)
MCHC: 34.9 g/dL (ref 30.0–36.0)
MCV: 92.9 fL (ref 78.0–100.0)
Platelets: 94 10*3/uL — ABNORMAL LOW (ref 150–400)
RDW: 13.6 % (ref 11.5–15.5)

## 2010-10-11 LAB — BASIC METABOLIC PANEL
BUN: 9 mg/dL (ref 6–23)
Chloride: 105 mEq/L (ref 96–112)
Glucose, Bld: 81 mg/dL (ref 70–99)
Potassium: 4.1 mEq/L (ref 3.5–5.1)

## 2010-10-11 NOTE — Assessment & Plan Note (Signed)
Summary: 6 WK FU Natale Milch #   Vital Signs:  Patient profile:   55 year old male Height:      73 inches (185.42 cm) Weight:      184 pounds (83.64 kg) BMI:     24.36 O2 Sat:      94 % on Room air Temp:     97.9 degrees F (36.61 degrees C) oral Pulse rate:   57 / minute Pulse rhythm:   irregular BP sitting:   102 / 62  (left arm) Cuff size:   regular  Vitals Entered By: Brenton Grills CMA Duncan Dull) (October 03, 2010 9:52 AM)  O2 Flow:  Room air CC: 6 week F/U/aj Is Patient Diabetic? Yes   Referring Provider:  Earl Lites, MD Primary Provider:  Earl Lites, MD  CC:  6 week F/U/aj.  History of Present Illness: pt is 5 mos s/p i-131 rx for hyperthyroidism due to grave's dz.  pt states he feels well in general, on synthroid 150 micrograms/day.  he takes rx for af also.  he did not notice any differernce n how he feels, with the increase of his synthroid.     Current Medications (verified): 1)  Lisinopril 5 Mg Tabs (Lisinopril) .... Take One Tablet By Mouth Daily 2)  Toprol Xl 50 Mg Xr24h-Tab (Metoprolol Succinate) .Marland Kitchen.. 1 Tab Once Daily 3)  Levothyroxine Sodium 150 Mcg Tabs (Levothyroxine Sodium) .Marland Kitchen.. 1 Tab Once Daily  Allergies (verified): No Known Drug Allergies  Past History:  Past Medical History: Last updated: 08/22/2010 Atrial fibrillation COPD Hypertension Hyperthyroidism Cardiomyopathy (EF 35% previous, now NL) Hepatitis C GERD Thrombocytopenia  Review of Systems  The patient denies weight loss and weight gain.    Physical Exam  General:  normal appearance.   Neck:  no thyroid enlargement. Additional Exam:  FastTSH              [H]  5.67 uIU/mL    Impression & Recommendations:  Problem # 1:  HYPOTHYROIDISM, POST-RADIATION (ICD-244.1) tsh is close to normal, so he can continue same rx  Other Orders: TLB-TSH (Thyroid Stimulating Hormone) (84443-TSH) Est. Patient Level III (81191)  Patient Instructions: 1)  blood tests are being ordered for you today.   please call [phone] to hear your test results. 2)  pending the test results, please continue the same medications for now 3)  Please schedule a follow-up appointment in 2 months. 4)  (update: i left message on phone-tree:  same synthroid)   Orders Added: 1)  TLB-TSH (Thyroid Stimulating Hormone) [84443-TSH] 2)  Est. Patient Level III [21308]

## 2010-10-21 ENCOUNTER — Other Ambulatory Visit: Payer: Self-pay | Admitting: Endocrinology

## 2010-11-07 LAB — CBC
HCT: 46.1 % (ref 39.0–52.0)
HCT: 48.4 % (ref 39.0–52.0)
HCT: 48.7 % (ref 39.0–52.0)
HCT: 50.6 % (ref 39.0–52.0)
Hemoglobin: 15.1 g/dL (ref 13.0–17.0)
Hemoglobin: 15.4 g/dL (ref 13.0–17.0)
Hemoglobin: 15.6 g/dL (ref 13.0–17.0)
Hemoglobin: 15.7 g/dL (ref 13.0–17.0)
MCHC: 32.8 g/dL (ref 30.0–36.0)
MCHC: 33.5 g/dL (ref 30.0–36.0)
MCV: 82.4 fL (ref 78.0–100.0)
MCV: 83.7 fL (ref 78.0–100.0)
MCV: 84.2 fL (ref 78.0–100.0)
Platelets: 72 10*3/uL — ABNORMAL LOW (ref 150–400)
Platelets: 88 10*3/uL — ABNORMAL LOW (ref 150–400)
Platelets: 93 10*3/uL — ABNORMAL LOW (ref 150–400)
RBC: 5.51 MIL/uL (ref 4.22–5.81)
RBC: 5.65 MIL/uL (ref 4.22–5.81)
RBC: 5.78 MIL/uL (ref 4.22–5.81)
RDW: 13.8 % (ref 11.5–15.5)
RDW: 14 % (ref 11.5–15.5)
RDW: 14.3 % (ref 11.5–15.5)
WBC: 5 10*3/uL (ref 4.0–10.5)
WBC: 5.6 10*3/uL (ref 4.0–10.5)
WBC: 7.3 10*3/uL (ref 4.0–10.5)

## 2010-11-07 LAB — DIFFERENTIAL
Basophils Absolute: 0 10*3/uL (ref 0.0–0.1)
Basophils Absolute: 0 10*3/uL (ref 0.0–0.1)
Basophils Absolute: 0 10*3/uL (ref 0.0–0.1)
Basophils Relative: 1 % (ref 0–1)
Basophils Relative: 1 % (ref 0–1)
Eosinophils Absolute: 0.1 10*3/uL (ref 0.0–0.7)
Eosinophils Relative: 0 % (ref 0–5)
Eosinophils Relative: 1 % (ref 0–5)
Eosinophils Relative: 1 % (ref 0–5)
Lymphocytes Relative: 25 % (ref 12–46)
Lymphocytes Relative: 30 % (ref 12–46)
Lymphocytes Relative: 40 % (ref 12–46)
Lymphs Abs: 1.5 10*3/uL (ref 0.7–4.0)
Lymphs Abs: 2.1 10*3/uL (ref 0.7–4.0)
Neutro Abs: 2.3 10*3/uL (ref 1.7–7.7)
Neutrophils Relative %: 50 % (ref 43–77)

## 2010-11-07 LAB — PROTIME-INR
INR: 1.2 (ref 0.00–1.49)
INR: 1.3 (ref 0.00–1.49)
Prothrombin Time: 16.1 seconds — ABNORMAL HIGH (ref 11.6–15.2)

## 2010-11-07 LAB — CARDIAC PANEL(CRET KIN+CKTOT+MB+TROPI)
CK, MB: 0.9 ng/mL (ref 0.3–4.0)
Relative Index: INVALID (ref 0.0–2.5)
Relative Index: INVALID (ref 0.0–2.5)
Total CK: 45 U/L (ref 7–232)
Troponin I: 0.01 ng/mL (ref 0.00–0.06)

## 2010-11-07 LAB — RAPID URINE DRUG SCREEN, HOSP PERFORMED
Amphetamines: NOT DETECTED
Barbiturates: NOT DETECTED
Benzodiazepines: NOT DETECTED
Cocaine: NOT DETECTED
Opiates: NOT DETECTED

## 2010-11-07 LAB — HEPATITIS B SURFACE ANTIBODY,QUALITATIVE: Hep B S Ab: NEGATIVE

## 2010-11-07 LAB — BASIC METABOLIC PANEL
CO2: 29 mEq/L (ref 19–32)
Calcium: 9 mg/dL (ref 8.4–10.5)
Chloride: 105 mEq/L (ref 96–112)
Creatinine, Ser: 0.55 mg/dL (ref 0.4–1.5)
Glucose, Bld: 103 mg/dL — ABNORMAL HIGH (ref 70–99)
Sodium: 137 mEq/L (ref 135–145)

## 2010-11-07 LAB — COMPREHENSIVE METABOLIC PANEL
AST: 34 U/L (ref 0–37)
BUN: 24 mg/dL — ABNORMAL HIGH (ref 6–23)
CO2: 21 mEq/L (ref 19–32)
Chloride: 99 mEq/L (ref 96–112)
Creatinine, Ser: 0.7 mg/dL (ref 0.4–1.5)
GFR calc Af Amer: >60 mL/min (ref >60)
GFR calc non Af Amer: >60 mL/min (ref >60)
Total Bilirubin: 2.4 mg/dL — ABNORMAL HIGH (ref 0.3–1.2)

## 2010-11-07 LAB — LIPID PANEL: VLDL: 10 mg/dL (ref 0–40)

## 2010-11-07 LAB — HEPATIC FUNCTION PANEL
Alkaline Phosphatase: 84 U/L (ref 39–117)
Bilirubin, Direct: 0.4 mg/dL — ABNORMAL HIGH (ref 0.0–0.3)
Indirect Bilirubin: 1.1 mg/dL — ABNORMAL HIGH (ref 0.3–0.9)
Total Bilirubin: 1.5 mg/dL — ABNORMAL HIGH (ref 0.3–1.2)

## 2010-11-07 LAB — HEPARIN LEVEL (UNFRACTIONATED)
Heparin Unfractionated: 0.34 IU/mL (ref 0.30–0.70)
Heparin Unfractionated: 0.57 IU/mL (ref 0.30–0.70)
Heparin Unfractionated: <0.1 IU/mL — ABNORMAL LOW (ref 0.30–0.70)

## 2010-11-07 LAB — HEPATITIS A ANTIBODY, IGM: Hep A IgM: NEGATIVE

## 2010-11-07 LAB — CMV ABS, IGG+IGM (CYTOMEGALOVIRUS): CMV IgM: <8 AU/mL (ref <30.0)

## 2010-11-07 LAB — HIV ANTIBODY (ROUTINE TESTING W REFLEX): HIV: NONREACTIVE

## 2010-11-07 LAB — EPSTEIN-BARR VIRUS VCA ANTIBODY PANEL
EBV EA IgG: 0.58 {ISR}
EBV VCA IgG: 6.28 {ISR} — ABNORMAL HIGH
EBV VCA IgM: 0.41 {ISR}

## 2010-11-07 LAB — D-DIMER, QUANTITATIVE: D-Dimer, Quant: 0.63 ug/mL-FEU — ABNORMAL HIGH (ref 0.00–0.48)

## 2010-11-08 LAB — URINALYSIS, ROUTINE W REFLEX MICROSCOPIC
Glucose, UA: NEGATIVE mg/dL
Ketones, ur: NEGATIVE mg/dL
Protein, ur: NEGATIVE mg/dL
pH: 6.5 (ref 5.0–8.0)

## 2010-11-08 LAB — CBC
MCHC: 34 g/dL (ref 30.0–36.0)
MCHC: 34.1 g/dL (ref 30.0–36.0)
MCV: 83.4 fL (ref 78.0–100.0)
MCV: 83.5 fL (ref 78.0–100.0)
Platelets: 108 10*3/uL — ABNORMAL LOW (ref 150–400)
Platelets: 90 10*3/uL — ABNORMAL LOW (ref 150–400)
Platelets: 91 10*3/uL — ABNORMAL LOW (ref 150–400)
RBC: 4.75 MIL/uL (ref 4.22–5.81)
RBC: 5.15 MIL/uL (ref 4.22–5.81)
RDW: 15.4 % (ref 11.5–15.5)
RDW: 15.8 % — ABNORMAL HIGH (ref 11.5–15.5)
WBC: 3.8 10*3/uL — ABNORMAL LOW (ref 4.0–10.5)
WBC: 4.9 10*3/uL (ref 4.0–10.5)

## 2010-11-08 LAB — DIFFERENTIAL
Basophils Absolute: 0 10*3/uL (ref 0.0–0.1)
Basophils Relative: 1 % (ref 0–1)
Eosinophils Absolute: 0 10*3/uL (ref 0.0–0.7)
Eosinophils Relative: 0 % (ref 0–5)
Eosinophils Relative: 0 % (ref 0–5)
Lymphocytes Relative: 11 % — ABNORMAL LOW (ref 12–46)
Lymphs Abs: 0.5 10*3/uL — ABNORMAL LOW (ref 0.7–4.0)
Monocytes Absolute: 0.5 10*3/uL (ref 0.1–1.0)
Monocytes Absolute: 0.5 10*3/uL (ref 0.1–1.0)
Monocytes Relative: 11 % (ref 3–12)
Neutro Abs: 2.5 10*3/uL (ref 1.7–7.7)

## 2010-11-08 LAB — COMPREHENSIVE METABOLIC PANEL
ALT: 36 U/L (ref 0–53)
ALT: 36 U/L (ref 0–53)
AST: 25 U/L (ref 0–37)
Albumin: 3.1 g/dL — ABNORMAL LOW (ref 3.5–5.2)
Albumin: 3.4 g/dL — ABNORMAL LOW (ref 3.5–5.2)
Alkaline Phosphatase: 113 U/L (ref 39–117)
BUN: 11 mg/dL (ref 6–23)
CO2: 21 mEq/L (ref 19–32)
Calcium: 8.6 mg/dL (ref 8.4–10.5)
Creatinine, Ser: 0.77 mg/dL (ref 0.4–1.5)
GFR calc Af Amer: >60 mL/min (ref >60)
GFR calc non Af Amer: >60 mL/min (ref >60)
Potassium: 3.7 mEq/L (ref 3.5–5.1)
Sodium: 131 mEq/L — ABNORMAL LOW (ref 135–145)
Sodium: 136 mEq/L (ref 135–145)
Total Protein: 6.4 g/dL (ref 6.0–8.3)

## 2010-11-08 LAB — URINE CULTURE
Colony Count: NO GROWTH
Culture: NO GROWTH

## 2010-11-08 LAB — BASIC METABOLIC PANEL
BUN: 12 mg/dL (ref 6–23)
CO2: 26 mEq/L (ref 19–32)
Glucose, Bld: 105 mg/dL — ABNORMAL HIGH (ref 70–99)
Potassium: 4.2 mEq/L (ref 3.5–5.1)
Sodium: 134 mEq/L — ABNORMAL LOW (ref 135–145)

## 2010-11-08 LAB — PROTIME-INR: INR: 1.6 — ABNORMAL HIGH (ref 0.00–1.49)

## 2010-11-08 LAB — T4, FREE: Free T4: 2.64 ng/dL — ABNORMAL HIGH (ref 0.89–1.80)

## 2010-11-08 LAB — CULTURE, BLOOD (ROUTINE X 2): Culture: NO GROWTH

## 2010-11-08 LAB — T3: T3, Total: 245.8 ng/dl — ABNORMAL HIGH (ref 80.0–204.0)

## 2010-11-08 LAB — HEPATITIS C ANTIBODY, REFLEX: HCV Ab: REACTIVE — AB

## 2010-11-08 LAB — BRAIN NATRIURETIC PEPTIDE: Pro B Natriuretic peptide (BNP): 679 pg/mL — ABNORMAL HIGH (ref 0.0–100.0)

## 2010-11-08 LAB — URINE MICROSCOPIC-ADD ON

## 2010-11-25 ENCOUNTER — Ambulatory Visit: Payer: Self-pay | Admitting: Cardiology

## 2010-12-02 ENCOUNTER — Encounter: Payer: Self-pay | Admitting: *Deleted

## 2010-12-05 ENCOUNTER — Ambulatory Visit: Payer: BC Managed Care – PPO | Admitting: Endocrinology

## 2010-12-05 DIAGNOSIS — Z0289 Encounter for other administrative examinations: Secondary | ICD-10-CM

## 2010-12-06 NOTE — H&P (Signed)
NAME:  Donald Myers, Donald Myers NO.:  [id number]   MEDICAL RECORD NO.:  [id number]          PATIENT TYPE:  EMS   LOCATION:  MAJO                         FACILITY:  [facility]   PHYSICIAN:  Rollene Rotunda, MD, FACCDATE OF BIRTH:  1956-02-20   DATE OF ADMISSION:  07/25/2008  DATE OF DISCHARGE:                              HISTORY & PHYSICAL   PRIMARY CARDIOLOGIST:  Rollene Rotunda, MD, [facility]   PRIMARY CARE PHYSICIAN:  The patient does not have one.   REASON FOR ADMISSION:  AFib with RVR.   HISTORY OF PRESENT ILLNESS:  This is a 55 year old Caucasian male with  no prior history of coronary artery disease who presented to Urgent Care  after 2-3 days of increased heart rate and blood pressure with sinus  congestion and cough and cold-like symptoms.  The patient has been  noting that his heart rate has been up and pounding in the morning when  he gets up.  He has been feeling more tired over the last 2-3 days with  increased shortness of breath and no appetite.  He went to see Urgent  Care and was evaluated.  He was found to be mildly hypertensive with a  blood pressure of 154/111 with a heart rate of 130.  The physician  called Dr. Antoine Poche and advised him that he was planning and sending him  to the emergency room.  He was sent over by EMS.  On arrival, the  patient's heart rate was between 140 and 160 and was found to be in AFib  with RVR.  The patient denied any chest pain.  He did complain of some  increased shortness of breath, coughing, congestion, and some mild  nausea, otherwise negative.   REVIEW OF SYSTEMS:  As above positive for fever, shortness of breath,  dyspnea on exertion, palpitations, cough, and low-grade nausea,  otherwise negative.   PAST MEDICAL HISTORY:  Inguinal hernia repair, negative for diabetes,  hypertension, hypercholesterolemia, or CAD.   SOCIAL HISTORY:  He lives in [place] with his mother.  He is a Investment banker, operational.  He is unmarried.  He is a 40 pack-year  smoker.  Social EtOH.  He denies  drug use.  No regular exercise.   FAMILY HISTORY:  Mother with hypertension.  Father deceased from old  age.  He has one brother who is in good health.   CURRENT MEDICATIONS:  None.   ALLERGIES:  None.   CURRENT LABS AVAILABLE:  Hemoglobin 16.5, hematocrit 50.6, white blood  cells 6.2, platelets 88.  Other labs are pending.  Chest x-ray is  pending.  EKG revealing atrial fibrillation with RVR.   PHYSICAL EXAMINATION:  CURRENT VITAL SIGNS:  Blood pressure 149/71,  heart rate irregular 150-160, respirations 30, temperature 98.8, and O2  sat 97% on 2 liters.  HEENT:  Head is normocephalic and atraumatic.  Eyes:  PERRLA.  Mucous  membranes of mouth are pink and moist.  Tongue is midline.  NECK:  Supple.  There are no carotid bruits.  There is no JVD.  There is  no thyromegaly on palpation.  CARDIOVASCULAR:  Irregular and rapid without murmurs,  rubs, or gallops.  Pulses are 2+ and equal without bruits.  LUNGS:  Some middle lobe crackles, otherwise clear.  ABDOMEN:  Soft, nontender with 2+ bowel sounds.  EXTREMITIES:  Without clubbing, cyanosis, or edema.  NEURO:  Cranial nerves II through XII grossly intact.   IMPRESSION:  1. Atrial fibrillation with rapid ventricular response, new onset.  2. Hypertension.  3. Cold and flu-like symptoms.   PLAN:  This is a 55 year old Caucasian male with no prior history of  coronary artery disease who presented to Urgent Care after 2-3 days of  increased heart rate, increased blood pressure, and cold and flu-like  symptoms, found to be in AFib with RVR.  The patient has been seen and  examined by myself and Dr. Rollene Rotunda.  The patient will be  admitted.  We will cycle cardiac enzymes, check echocardiogram and TSH,  and place the patient on a Cardizem drip along with heparin.  We will  also do risk stratification checking lipids and LFTs and also a D-dimer.  We will also do a urine drug screen and follow  making further  recommendations based upon hospital course and the patient's response to  treatment.      Bettey Mare. Lyman Bishop, NP      Rollene Rotunda, MD, [facility]  Electronically Signed    KML/MEDQ  D:  07/25/2008  T:  07/26/2008  Job:  [id number]

## 2010-12-06 NOTE — H&P (Signed)
NAME:  Donald Myers, FINES NO.:  [id number]   MEDICAL RECORD NO.:  [id number]          PATIENT TYPE:  INP   LOCATION:  1416                         FACILITY:  [facility]   PHYSICIAN:  Michiel Cowboy, MDDATE OF BIRTH:  [birth year]   DATE OF ADMISSION:  08/27/2008  DATE OF DISCHARGE:                              HISTORY & PHYSICAL   PRIMARY CARE Donald Myers:  Dr. Ulyess Mort.   CHIEF COMPLAINT:  Altered mental status.   The patient is a 55 year old gentleman with recent admission to [facility] with history of atrial fibrillation, new onset, new  diagnosis of hepatitis C and cardiomyopathy with EF of 35%.  The patient  has severe hyperthyroidism, followed by Dr. Everardo All.  The patient was at  his baseline of health up until a few days ago when he started to not  feel well.  He denies any chest pain.  No shortness of breath.  No  wheezing.  No coughing, but today when he came into his work, he showed  up at the wrong time and per his coworkers was extremely confused.  They  brought him into the emergency department where the patient was noted to  be febrile up to 102.9, and noted to have a left lower lobe pneumonia at  which point [facility] was called for admission.  The patient  himself currently appears to be alert and oriented and denied any chest  pain.  Unsure about his medications, but states that he has been taking  them.   Otherwise review of systems unremarkable, except he does endorse of  weight loss which he attributes to his hyperthyroidism.   PAST MEDICAL HISTORY:  1. History of atrial fibrillation and cardiomyopathy with EF of 35%,      thought to be possibly viral.  2. Hyperthyroidism.  3. Elevated hepatitis C antibody  4. Elevated Epstein-Barr virus antibody  5. Thrombocytopenia.  6. History of inguinal hernia repair.   SOCIAL HISTORY:  The patient has occasional marijuana use, but otherwise  he used to be a heavy smoker and  states he has not been smoking  recently.  He states he drinks a beer every now and then, but not on a  regular basis.   MEDICATIONS:  1. Coumadin 1.5 mg daily.  2. Metoprolol 200 mg daily.  I wonder if it is actually Toprol.  3. Propylthiouracil 200 mg t.i.d.   ALLERGIES:  NOT ALLERGIC TO ANY MEDICATIONS.   FAMILY HISTORY:  No known family history of thyroid disorders.   PHYSICAL EXAMINATION:  VITAL SIGNS:  Temperature 102.9, blood pressure  144/66, pulse 93, respirations 20.  Satting 93% room air.  GENERAL:  The patient currently appears to be in no acute distress, very  thin male.  HEENT:  Head nontraumatic.  There is proptosis of eyes bilaterally  noted.  NECK:  Supple.  No lymphadenopathy, no JVD noted.  LUNGS:  There is occasional crackles at the bases and somewhat distant  breath sounds throughout, but otherwise nonspecific.  No wheezing  appreciated.  HEART:  Regular rate and rhythm.  No murmurs, rubs or gallops.  ABDOMEN:  Soft, nontender, nondistended, but scaphoid.  EXTREMITIES:  Lower extremities with trace edema bilaterally, but  overall somewhat diminished skin turgor.  NEUROLOGIC:  Appears to be intact and alert and oriented x3.   LABORATORY DATA:  White blood cell count 4.9, hemoglobin 13.5.  Sodium  131, potassium 3.9, creatinine 0.77, total bilirubin 2.6, alk phos 131  AST 25, ALT 36, albumin 3.4, total protein 6.8.  UA showing blood,  moderate amount, otherwise unremarkable.  Urine culture and blood  culture pending.  Chest x-ray showing COPD and left lower lobe  pneumonia.  INR 1.5.   On the monitor, appears to be in sinus rhythm.   ASSESSMENT/PLAN:  This is a 55 year old gentleman with history of  cardiomyopathy with ejection fraction of 35%, atrial fibrillation,  hyperthyroidism and possible hepatitis C.  The patient presents with  acute onset of left lower lung pneumonia and fever.   Pneumonia.  Since the patient had exposure and had been in the  hospital  recently will cover broadly with Avelox.  Since the patient has history  of chronic obstructive pulmonary disease, I will make sure he has  albuterol and Atrovent.  Since the patient is febrile, will give very  gentle rehydration being aware of his ejection fraction.  Marland Kitchen   History of atrial fibrillation, currently appears to be in sinus rhythm  with documented EKG.  Place on telemetry.  Continue Coumadin.  Metoprolol withholding parameters.   Hyperthyroidism.  Continue PTU and check TSH, T3, plus T4 levels.   History of hepatitis C with somewhat elevated total bilirubin, which may  be actually unrelated as the patient had mainly indirect bilirubin in  the past, but we will obtain right upper quadrant ultrasound to evaluate  for any evidence of cirrhosis given some thrombocytopenia.   History of heart failure with ejection fraction of 35%, currently  appears to be actually fluid down, but be very careful with IV fluids.  He is not given any Lasix at baseline.  We will check beta natriuretic  peptide.  May need to  discuss with cardiology to see if he would benefit from repeat echo at  some point to see if there is any improvement in his ejection fraction.  Will follow closely.   Prophylaxis.  Protonix.  This patient is going to be already on Coumadin  for his afebrile.      Michiel Cowboy, MD  Electronically Signed     AVD/MEDQ  D:  08/27/2008  T:  08/27/2008  Job:  [id number]   cc:   Gregary Signs A. Everardo All, MD  [address]  [place]  [place] [address]

## 2010-12-06 NOTE — Discharge Summary (Signed)
NAME:  Donald Myers, Donald Myers NO.:  [id number]   MEDICAL RECORD NO.:  [id number]          PATIENT TYPE:  INP   LOCATION:  1416                         FACILITY:  [facility]   PHYSICIAN:  Donald Levins, MD      DATE OF BIRTH:  [birth year]   DATE OF ADMISSION:  08/26/2008  DATE OF DISCHARGE:  08/29/2008                               DISCHARGE SUMMARY   DISCHARGE DIAGNOSES:  Include:  1. Left lower lobe pneumonia.  2. Chronic obstructive pulmonary disease.  3. Atrial fibrillation.  4. Cardiomyopathy.  5. Hyperthyroidism.  6. New finding hepatitis C with RIBA pending at time of discharge.  7. Gastroesophageal reflux disease.  8. Chronic Coumadin.  9. Thrombocytopenia, mild.   PROCEDURES:  None.   CONSULTS:  None.   HISTORY AND PHYSICAL:  See the dictated per Donald Myers.   HOSPITAL COURSE:  Donald Myers is a 55 year old white male who is  actually not a primary care patient of [employer].  He see Dr.  Everardo Myers as an endocrine consultation only.  He has plans to establish  with a primary care physician, Donald Myers, on September 01, 2008.  He was  ad[facility] to [facility] anyway August 27, 2008, with  chest x-ray suggestive of COPD and left lower lobe pneumonia with  appropriate signs and symptoms.  He was treated in the usual fashion  with IV Avelox, albuterol and Atrovent, IV fluids.  He was continued on  his usual medications including Coumadin and metoprolol.  He was found  to have elevated BNP of 679.  TSH within normal limits.  BMET otherwise  unremarkable with BUN 21, creatinine 0.62, hemoglobin 13, white blood  cell count 3.1.  His rate control was intensified while hospitalized  with increase of the metoprolol to b.i.d.  He was noted to have  hepatitis C antibody positive and hepatitis C RIBA testing was ordered  and performed and apparently is pending at time of discharge.  On second  day of hospitalization, chest x-ray showed clearing of the  infiltrate on  the IV Avelox and despite the temperature of 103.8 on August 27, 2008,  antibiotics were discontinued on August 28, 2008.  At the time of this  dictation, August 29, 2008, the patient is ambulatory with some mild  shortness of breath but no wheezing per se.  He is eating well, taking  p.o. without fever, chills, or other symptoms such as chest pain or  wheezing.  It is felt he had gained maximum benefit from this  hospitalization and he is to be discharged home at this time.  However,  I disagree with the discontinuation of antibiotics.  Although the chest  x-ray did seem to show rapid clearing of the left lower lobe infiltrate,  it is my opinion that he has mild pneumonia that cleared rapidly with IV  antibiotics radiographically but still likely at least subclinically  present.  It is therefore my intention to treat for community-acquired  pneumonia with full course of antibiotics.   DISPOSITION:  Discharged to home in good condition.  There were no  activity or  dietary restrictions except for heart-healthy diet.   DISCHARGE MEDICATIONS:  Should include:  1. Avelox 400 mg 1 p.o. daily for 7 days.  2. Levothyroxine 4 tablets t.i.d.  3. Metoprolol 50 mg b.i.d.  4. Coumadin 7.5 mg as directed.  5. PTU 200 mg t.i.d.  6. Diltiazem 180 mg p.o. daily.  7. Combivent metered-dose inhaler 2 puffs q.i.d. p.r.n.   He plans to have regular followup with Coumadin as previous and will  further be followed by his primary care physician when he establishes.  Again, it is of note that the hepatitis C RIBA is pending at time of  discharge.      Donald Levins, MD  Electronically Signed     JWJ/MEDQ  D:  08/29/2008  T:  08/29/2008  Job:  [id number]   cc:   Donald Gess. Norins, MD  [address]  [place]  [place] [address]

## 2010-12-06 NOTE — Discharge Summary (Signed)
NAME:  Donald Myers, Donald Myers NO.:  [id number]   MEDICAL RECORD NO.:  [id number]          PATIENT TYPE:  INP   LOCATION:  2020                         FACILITY:  [facility]   PHYSICIAN:  Rollene Rotunda, MD, FACCDATE OF BIRTH:  18-Jan-1956   DATE OF ADMISSION:  07/25/2008  DATE OF DISCHARGE:  07/29/2008                               DISCHARGE SUMMARY   PRIMARY CARDIOLOGIST:  Rollene Rotunda, MD, [facility]   PRIMARY CARE PHYSICIAN:  Darrol Jump, MD   CONSULTING PHYSICIANS:  1. Sean A. Everardo All, MD, Endocrinology  2. Genene Churn. Cyndie Chime, MD, Hematology   PROCEDURES PERFORMED DURING HOSPITALIZATION:  Echocardiogram dated  July 27, 2008.  Results, LVEF moderately-to-markedly decreased with an  EF of 35%, moderate diffuse left ventricular hypokinesis, left  ventricular wall thickness at the upper limits of normal, aortic valve  thickness was mildly increased, there was mild mitral valvular  regurgitation, left atrium was dilated, right ventricle was mildly  dilated, right ventricular systolic function mildly reduced, the right  atrium was mildly dilated.   FINAL DISCHARGE DIAGNOSES.:  1. Atrial fibrillation with rapid ventricular response.  2. Hypertension.  3. Thrombocytopenia.  4. Hyperthyroidism.  5. Chronic obstructive pulmonary disease.  6. Ongoing tobacco abuse.   HOSPITAL COURSE:  This is a 55 year old Caucasian male with no prior  history of coronary artery disease who presented to Urgent Care on  July 25, 2008 after 2-3 days of increased heart rate and blood  pressure with sinus congestion, cough, and cold-like symptoms.  The  patient was seen by Urgent Care and found that the patient was  hypertensive with a heart rate of 130 beats per minute.  The patient was  transferred to [facility] Emergency Room via EMS.  On arrival, the  patient's heart rate was between 140 and 160, afebrile, RVR, although  the patient had mild shortness of breath, he denied any chest  pain,  nausea, vomiting, or diarrhea.   On evaluation by myself and Dr. Rollene Rotunda, the patient was started  on Cardizem drip after 20 mg bolus and also on heparin.  The patient  also had cardiac enzymes cycled and TSH cycled.  The patient's cardiac  enzymes were found to be negative x3.  However, TSH was markedly  abnormal at 0.006.  The patient had a consultation by Dr. Romero Belling  on July 26, 2008 for evaluation of hyperthyroidism.  The patient was  started on PTU 20 mg p.o. t.i.d. and followed by Dr. Everardo All with  followup appointment post discharge to be scheduled.  In the interim,  the patient was always also seen by Dr. Cyndie Chime secondary to  thrombocytopenia with initial platelets of 73 with 88 on admit.  The  patient had multiple labs completed by Dr. Cyndie Chime and the patient  will be followed up on as an outpatient as well.   The patient was started on p.o. Coumadin with heparin bridging prior to  discharge with a followup appointment with Coumadin Clinic within 2 days  postdischarge.  The patient's final INR was 1.7 on 7.5 mg daily.   DISCHARGE LABS:  PT 20.7, INR 1.7, hemoglobin  15.5, hematocrit 46.3,  white blood cells 5.6, platelets 93.  BMET, sodium 137, potassium 4.9,  chloride 105, CO2 of 29, glucose 103, BUN 18, creatinine 0.55.  Urine  drug screen on admission positive for THC.  Thyroid on admission 0.006.   DIAGNOSTIC STUDIES:  Chest x-ray on admission, no acute cardiopulmonary  disease, findings of COPD and probable pulmonary arterial hypertension.  EKG on discharge, atrial fibrillation with a ventricular rate of 70  beats per minute.   DISCHARGE MEDICATIONS:  1. Metoprolol 200 mg daily.  2. Coumadin 5 mg daily.  3. PTU 200 mg three times a day.   ALLERGIES:  No known drug allergies.   FOLLOWUP PLANS AND APPOINTMENT:  1. The patient will follow up with Dr. Romero Belling on August 03, 2008 a.m.  2. The patient will follow up in the  Coumadin Clinic on Friday,      July 31, 2008 at 2:45 p.m.  3. The patient will follow up with medical therapy management with      pharmacist following the Coumadin appointment on July 31, 2008.  4. The patient will follow up with Dr. Rollene Rotunda on August 14, 2008 at 10:30 a.m.  5. The patient will follow up with Dr. Cyndie Chime, Hematology, their      office will call to make that appointment with the patient.  6. The patient has been advised of new medications that he will be      taking at home and need for quick followup with Coumadin Clinic for      PT/INR evaluation, and adjustment of Coumadin.  7. The patient has been advised to notify us of any frank bleeding or      melena by calling our office.      Bettey Mare. Lyman Bishop, NP      Rollene Rotunda, MD, [facility]  Electronically Signed    KML/MEDQ  D:  07/29/2008  T:  07/29/2008  Job:  [id number]   cc:   Gregary Signs A. Everardo All, MD  Genene Churn. Cyndie Chime, M.D.  Darrol Jump, MD

## 2010-12-06 NOTE — Consult Note (Signed)
NAME:  Donald Myers, Donald Myers NO.:  [id number]   MEDICAL RECORD NO.:  [id number]          PATIENT TYPE:  INP   LOCATION:  2020                         FACILITY:  [facility]   PHYSICIAN:  Sean A. Everardo All, MD    DATE OF BIRTH:  1955/12/17   DATE OF CONSULTATION:  07/26/2008  DATE OF DISCHARGE:                                 CONSULTATION   REASON FOR ADMISSION:  Hyperthyroidism.   HISTORY OF PRESENT ILLNESS:  A 55 year old man admitted by Cardiology  with atrial fibrillation.  He has several days of moderate palpitations  in the chest and slight associated tremor of the hands.  He also had a  febrile illness with some productive cough and low-grade fever.   PAST MEDICAL HISTORY:  1. Smoker.  2. No previous history of thyroid disease.   He has not recently been under the care of a doctor and takes no  outpatient medications.   SOCIAL HISTORY:  He is single.  He works at [employer].   REVIEW OF SYSTEMS:  Denies the following excessive diaphoresis,  shortness of breath, double vision, and muscle weakness.   PHYSICAL EXAMINATION:  VITAL SIGNS:  Blood pressure 129/79, heart rate  79, respiratory rate 20, and temperature is 97.9.  GENERAL:  Lean man in no distress.  SKIN:  Not diaphoretic.  No rash.  HEENT:  There is moderate bilateral proptosis.  No periorbital swelling.  NECK:  The thyroid is slightly enlarged on the left and two times normal  size in the right.  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  CHEST:  Clear to auscultation.  No respiratory distress.  CARDIOVASCULAR:  No JVD, no edema.  Irregular rhythm, but normal heart  rate.  ABDOMEN:  Soft, nontender, no hepatosplenomegaly, no mass.  EXTREMITIES:  No deformity.  NEUROLOGIC:  Alert and oriented.  Does not appear anxious nor depressed.  Cranial nerves are intact.  He readily moves all fours and there is a  slight postural tremor present.   LABORATORY STUDIES:  TSH equal 0.006.  Platelet  count 73,000, WBC 7300.   IMPRESSION:  1. Hyperthyroidism of uncertain etiology.  2. Thrombocytopenia, not related to hyperthyroidism, except that they      may have a common autoimmune etiology.  3. Acute febrile illness.  This may have exacerbated his symptoms and      brought him into medical attention.  4. Atrial fibrillation due to hyperthyroidism, may be exacerbated by      acute febrile illness.  5. Low LDL probably due to hyperthyroidism.   PLAN:  1. PTU 200 mg 3 times a day.  2. Follow up with me in the office next week.  3. It would be a good idea for him to also establish with Dr. Sanda Linger at the [facility] office as his new primary care doctor.      Alternatively, he could go to urgent medical care to take his      primary care there.  In either case, he should get establish with a      primary care doctor.  Sean A. Everardo All, MD  Electronically Signed     SAE/MEDQ  D:  07/26/2008  T:  07/26/2008  Job:  [id number]

## 2010-12-06 NOTE — Assessment & Plan Note (Signed)
[facility]                            CARDIOLOGY OFFICE NOTE   CORNELUIS, ALLSTON                  MRN:          [id number]  DATE:08/13/2008                            DOB:          04-10-1956    PRIMARY CARE PHYSICIAN:  Sean A. Everardo All, MD   REASON FOR PRESENTATION:  Evaluate the patient with atrial fibrillation  and cardiomyopathy.   HISTORY OF PRESENT ILLNESS:  The patient is a very pleasant 55 year old  gentleman, who we first met on July 25, 2008.  He was referred by Dr.  Cleta Alberts for evaluation of atrial fibrillation and probable hyperthyroidism.  At the time of admission, he did have AFib with a rapid ventricular  response.  He was not particularly feeling this.  He did have a sinus  congestion, cough, and cold-like symptoms which is why he presented to  urgent care.  He was referred to [facility] and was found to be  profoundly hyperthyroid.  Dr. Romero Belling saw him and started him on  PTU.  He also had thrombocytopenia and was seen by Dr. Cyndie Chime.  It  was felt that the patient was safe to start on Coumadin with his stable  thrombocytopenia, this was to be followed expectantly.  The etiology may  have been viral.  He did have viral serologies drawn.  He does have an  elevated hepatitis C antibody.  Also noticed an elevated Epstein-Barr  virus antibody.   The patient has seen Dr. Everardo All in followup.  He is still being managed  for the hyperthyroidism.  He has in atrial fibrillation still.  However,  prior to discharge, he was reasonably rate controlled with a beta-  blocker.  He feels no chest discomfort, neck or arm discomfort.  He has  had no palpitations, presyncope, or syncope.  He has no PND or  orthopnea.  He says he is eating well and he actually has more energy,  though he never thought he was particularly tired.   PAST MEDICAL HISTORY:  1. Atrial fibrillation (probably related to his hyperparathyroidism).  2.  Cardiomyopathy (EF of about 35% with global hypokinesis probably      related to his cardiomyopathy and hyperthyroidism).  3. Thrombocytopenia.   PAST SURGICAL HISTORY:  Inguinal hernia repair.   ALLERGIES AND INTOLERANCES:  None.   MEDICATIONS:  1. Coumadin.  2. Metoprolol 200 mg daily.  3. Propylthiouracil 200 mg t.i.d.   REVIEW OF SYSTEMS:  As stated in the HPI and negative for other systems.   PHYSICAL EXAMINATION:  GENERAL:  The patient is in no distress.  VITAL SIGNS:  Blood pressure 136/81, heart rate 85 and regular, weight  156 pounds, and body mass index 21.  HEENT:  Eyelids unremarkable, pupils equal, round, and react to light,  fundi not visualized, oral mucosa unremarkable.  NECK:  No jugular venous distention at 45 degrees, carotid upstroke  brisk and symmetrical, no bruits, no thyromegaly.  LYMPHATICS:  No cervical, axillary, or inguinal adenopathy.  LUNGS:  Clear to auscultation bilaterally.  BACK:  No costovertebral angle tenderness.  CHEST:  Unremarkable.  HEART:  PMI not  displaced or sustained, S1 and S2 within normal limits,  no S3, no S4, no clicks, no rubs, no murmurs.  ABDOMEN:  Flat, positive bowel sounds, normal in frequency and pitch, no  bruits, no rebound, no guarding, no midline pulsatile mass, no  hepatomegaly, no splenomegaly.  SKIN:  No rashes, no nodules.  EXTREMITIES:  Pulses 2+ throughout, no edema, no cyanosis, no clubbing.  NEUROLOGIC:  Oriented to person, place, and time.  Cranial nerves II  through XII grossly intact.  Motor grossly intact.   ASSESSMENT AND PLAN:  1. Atrial fibrillation.  His rate seems to be well controlled.  My      plan is to try to get him back in normal rhythm once he is      euthyroid.  This will require cardioversion.  He is going to remain      on the Coumadin until then.  He will remain on the beta-blocker.  2. Cardiomyopathy.  He has class I symptoms.  At this point, I think      this is a rate-related  cardiomyopathy and we will plan on getting      him back into normal rhythm.  I will then reassess his ejection      fraction down the road.  3. Hyperthyroidism.  Per Dr. Everardo All.  4. 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  I will discuss this      with Dr. Cleta Alberts and one of our gastroenterologists.  He will most      likely need referral to a gastroenterologist.  5. Thrombocytopenia.  He was seen by Dr. Cyndie Chime.  This may be      related to the positive hepatitis.  He also had an elevated EBV      titer.  This can be followed expectantly.  6. Followup.  I am going to see him back in 2 months or sooner if      needed.     Rollene Rotunda, MD, [facility]  Electronically Signed    JH/MedQ  DD: 08/13/2008  DT: 08/14/2008  Job #: [id number]   cc:   Brett Canales A. Cleta Alberts, M.D.

## 2010-12-06 NOTE — Letter (Signed)
August 28, 2008    Donald Myers A. Cleta Alberts, MD  [address]  [place], [place] [address]   RE:  NIRVAAN, FRETT  MRN:  [id number]  /  DOB:  1956-07-05   Dear Donald Myers,   Thanks for talking with me the other day about Mr. Brine.  As you  recall, you sent him to the ER for rapid AFib.  As you  surmised, he was  profoundly hyperthyroid.  I got Dr. Romero Belling involved to manage this  and he has seen him as an outpatient for this.  He was noted to have  thrombocytopenia.  Dr. Cyndie Chime saw him and do some labs.  When he  was in the office, I was able to retrieve these final results and found  that he had a positive hepatitis C and an elevated Epstein-Barr virus.  I was contacting him to make sure he was aware of this and as per our  discussion and followup with you.  I now see that he is back in the  hospital with pneumonia.  The admission note from the hospitalist does  recognize that the hepatitis C was positive.  Therefore, I am sure the  patient is aware now.  I do see that various notes are confused on his  primary care physician.  He has clearly identified you and I will try to  coordinate efforts to get him back to you, once he is discharged.  I am  going to send along this letter along with the viral serologies from his  previous hospitalization.  Hopefully, you will see in my office note.  I  will make sure that Dr. Everardo All identifies you as his primary care  physician as well and I will speak with the hospitalist, who currently  caring for the patient.   Thanks again for allowing [place] participate in this pleasant gentleman's  care.  I do suspect that we will get him back into sinus rhythm.  Of  note, he does have a cardiomyopathy which is probably rate related and I  expect this will improve as well.  All of this will happen after his  hyperthyroidism is treated.  If you have any questions do not hesitate  to give me a call.  My cell phone numbers [phone].     Sincerely,      Rollene Rotunda, MD, [facility]  Electronically Signed    JH/MedQ  DD: 08/28/2008  DT: 08/28/2008  Job #: [id number]

## 2010-12-06 NOTE — Consult Note (Signed)
NAME:  Donald, Myers NO.:  [id number]   MEDICAL RECORD NO.:  [id number]          PATIENT TYPE:  INP   LOCATION:  2020                         FACILITY:  [facility]   PHYSICIAN:  Genene Churn. [place], M.D.DATE OF BIRTH:  1956/03/04   DATE OF CONSULTATION:  07/28/2008  DATE OF DISCHARGE:                                 CONSULTATION   This is a hematology consultation requested to evaluate this man for  thrombocytopenia.   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  He had a viral illness  back in November.  He developed recurrent symptoms in the 72 hours prior  to the admission of July 25, 2008, with a cough, low-grade fever,  fatigue, and then rapid heartbeat.  He presented to an Urgent Care  Center and was found to be hypertensive and tachycardic.  He was  referred to the [facility] Emergency Department where heart rate  documented at 140-160 and cardiogram showed new onset atrial  fibrillation.  He was admitted for further evaluation.   Initial CBC showed platelet count of 88,000 with subsequent fall to  68,000.  Hemoglobin 16.5, hematocrit 50.6, MCV 84, white count 6200, 57  neutrophils, 25 lymphocytes, and 18 monocytes.  Repeat CBC today with  hemoglobin 15.4, white count 4800, 47 neutrophils, 40 lymphocytes, 11  monocytes, and platelets 68,000.   He appeared to be clinically hyperthyroid and a TSH in fact was markedly  suppressed at 0.006.  He has been started on PTU.  An initial chest  radiograph was normal except for changes suggestive of obstructive  airway disease in a 1-pack per day cigarette smoker.   His only prehospital medications were vitamins and some nutritional  supplements including blue-green algae.  He does admit to rare quinine  use in the form of Gin and tonic drink which he has rarely maybe 1 or  2  per month.   He denied any rash, polyarthralgia, polymyalgia, or pharyngitis.  He has  no history of hepatitis or mononucleosis and he has no HIV risk factors.   His arrhythmia was initially treated with IV Cardizem.  He has been  covered with IV heparin and low-dose aspirin.  He is now on oral  medications with Toprol 100 mg daily and Coumadin dose being adjusted.   PAST MEDICAL HISTORY:  Unremarkable except above.  He has had bilateral  hernia repairs in the past.  Never had his tonsils out as a child.  No  prior history of MI, hypertension, diabetes, or ulcers.  No prior  history of thyroid disease.  No renal disease.  No history of seizure,  stroke, or blood clots.   FAMILY HISTORY:  He lives with his mother who has also had recurrent  viral illness this winter and is currently sick at home.   SOCIAL HISTORY:  He works as a Investment banker, operational at [employer].  Not  married.  No children.  He is heterosexual.  He smokes 1 pack of  cigarettes daily.  Rare alcoholic beverage.  No history of blood  transfusions.  No history of recreational drug use.   REVIEW OF SYSTEMS:  He has never been told he has a blood problem in the  past.  He has had no problems with excessive bleeding or bruising.   PHYSICAL EXAMINATION:  GENERAL:  A thin Caucasian man.  VITAL SIGNS:  He has been afebrile since admission.  Current temperature  97.5, blood pressure 133/81, pulse rate now 43, on medication,  respirations 18, and oxygen saturation 98% on room air.  SKIN:  Hair and nails normal.  No ecchymosis, petechiae, or rash.  HEENT:  He does have moderate proptosis of his eyes.  Pupils equal and  reactive to light.  Pharynx, no erythema or exudate.  Tonsils have  atrophied.  NECK:  Supple.  There is no cervical, supraclavicular, axillary, or  inguinal adenopathy.  LUNGS:  Clear and resonant to percussion.  HEART:  Irregularly irregular cardiac rhythm.  No murmur.  ABDOMEN:  Soft and nontender.  No  mass.  No organomegaly.  EXTREMITIES:  No edema.  No calf tenderness.  There is some venous  stasis changes of his legs.  NEUROLOGIC:  With no focal deficit.   Review of the peripheral blood film shows normochromic normocytic red  cells.  Platelets are mildly decreased in number 5-10 per high-power  field.  There are many large platelets which will artificially lower the  machine count.  There are occasional benign reactive lymphocytes of the  large granular type seen with acute and chronic viral infections.   IMPRESSION:  Moderate thrombocytopenia without anemia and with a normal  white blood count.  White count differential has shifted towards  lymphocytes over the last few days.   Overall, my clinical impression is that this thrombocytopenia is likely  related to his acute viral illness.  Platelets can also be moderately  decreased with acute thyroid disease.  Immune thrombocytopenia remains  in the differential.   RECOMMENDATIONS:  I think observation alone at present is the most  reasonable approach.  I would stop his aspirin.  Stop heparin as soon as  feasible.  I see no problem in continuing Coumadin with close  monitoring.   I will check a viral panel to include acute hepatitis profile, HIV, EBV,  and CMV.   There is no indication for a bone marrow biopsy at this time.   Thank you for this consultation.  I can follow the patient as an  outpatient after hospital discharge.      Genene Churn. Cyndie Chime, M.D.  Electronically Signed     JMG/MEDQ  D:  07/28/2008  T:  07/29/2008  Job:  [id number]   cc:   Rollene Rotunda, MD, [facility]  Sean A. Everardo All, MD

## 2010-12-09 NOTE — H&P (Signed)
NAME:  SHLOIME, KEILMAN NO.:  [id number]   MEDICAL RECORD NO.:  [id number]          PATIENT TYPE:  INP   LOCATION:  1827                         FACILITY:  [facility]   PHYSICIAN:  Sharlet Salina T. Hoxworth, M.D.DATE OF BIRTH:  Nov 16, 1955   DATE OF ADMISSION:  10/31/2005  DATE OF DISCHARGE:                                HISTORY & PHYSICAL   PRIMARY CARE PHYSICIAN:  None.   CHIEF COMPLAINT:  Right inguinal pain.   HISTORY OF PRESENT ILLNESS:  Mr. Mofield is a 55 year old male patient  whose past medical history is significant for repair of a left inguinal  hernia at about age 66.  The patient developed acute right inguinal pain  after bending over on Sunday of this week. Since that time he has had  intermittent complaints of pain, usually only with bending over or getting  up, occasionally with lifting.  When this pain occurs, it is quite severe.  It is resolved with straightening up and resting.  There is no radiation to  this pain such as up into the abdomen, down into the testicles, or down into  the legs.  In the ER, the patient was evaluated.  A CT of the abdomen and  pelvis was done and did demonstrate an incarcerated area of tissue in the  inguinal hernia consisting of fat, possible necrosis, noted edema, and  questionable compression of the femoral vein. Surgery has been called for  consultation and probable admission.   REVIEW OF SYSTEMS:  Again, there is no pain with voiding or defecation, no  radiation of the pain, no GI symptoms.  Review of Systems otherwise  unremarkable.   PAST MEDICAL HISTORY:  None.   PAST SURGICAL HISTORY:  1.  Left inguinal hernia repair at age 31.  2.  Foreign body removal from throat at age 82; patient swallowed a whistle.   FAMILY MEDICAL HISTORY:  Mother with hypertension, brother with inguinal  hernia issues.   SOCIAL HISTORY:  He has been smoking since age of 47.  He currently smokes  one pack per day.  He has social  alcohol.  He works as a Investment banker, operational at [employer].   ALLERGIES:  No known drug allergies.   CURRENT MEDICATIONS:  Include multivitamins.   PHYSICAL EXAMINATION:  GENERAL:  Pleasant male, currently without any acute  complaints of pain.  VITAL SIGNS:  Temperature 98, blood pressure 165/73, pulse 79 and regular,  respirations 20.  HEENT:  Head is normocephalic, sclerae not injected.  NECK:  Supple, no adenopathy.  CHEST: Bilateral lung sounds clear to auscultation.  Respiratory effort is  unlabored.  CARDIAC:  S1, S2. No murmurs, rubs, or gallops.  Pulses regular.  ABDOMEN: Soft, nontender, nondistended, without hepatosplenomegaly, masses,  or bruits.  No umbilical hernia noted.  GENITOURINARY:  External genitalia normal appearance.  Testes are descended.  There are no wall defects noted in either groin.  The patient was examined  supine in the bed.  I did not have the patient stand to check for inguinal  defects.  EXTREMITIES:  Symmetrical in appearance without edema, cyanosis, or  clubbing.  NEUROLOGIC: The patient is alert and oriented x3.  Moving all extremities x4  without focal neurological deficits.   LABORATORY DATA:  Urinalysis is negative.   Diagnostic CT of the abdomen and pelvis as noted with an inguinal hernia  containing only fat, no bowel.  Positive edema, positive necrosis, probable  compression of the femoral vein.   IMPRESSION:  Right inguinal hernia involving fat tissue only, no bowel.   PLAN:  1.  Admit.  2.  Continue n.p.o. status until surgical repair timing decided. This will      be determined by Dr. Johna Sheriff at his evaluation.  The patient may go      this evening for surgery versus waiting until the morning.  3.  Will start IV fluids, Dilaudid for pain, Phenergan for nausea.  4.  Will check CBC, CMET, and PT/PTT preop as well as EKG and chest x-ray      due to patient's age being greater than 50 and history of smoking.      Allison L.  Rennis Harding, N.P.      Lorne Skeens. Hoxworth, M.D.  Electronically Signed    ALE/MEDQ  D:  10/31/2005  T:  10/31/2005  Job:  [id number]

## 2010-12-09 NOTE — Op Note (Signed)
NAME:  Donald Myers, Donald Myers           ACCOUNT NO.:  [id number]   MEDICAL RECORD NO.:  [id number]          PATIENT TYPE:  AMB   LOCATION:  SDS                          FACILITY:  [facility]   PHYSICIAN:  Sharlet Salina T. Hoxworth, M.D.DATE OF BIRTH:  07-20-1956   DATE OF PROCEDURE:  11/02/2005  DATE OF DISCHARGE:                                 OPERATIVE REPORT   POSTOPERATIVE DIAGNOSIS:  Right inguinal hernia.   SURGICAL PROCEDURES:  Repair of right inguinal hernia.   SURGEON:  Lorne Skeens. Hoxworth, M.D.   ANESTHESIA:  Laryngeal mask general.   BRIEF HISTORY:  Donald Myers is a 55 year old male who presents with a  intermittently painful lump in his right groin.  Examination.  Has revealed  a tender reducible right inguinal hernia.  Repair, using mesh has been  recommended and accepted.  We have agreed on general anesthesia.  Nature of  procedure, indications, risks of bleeding, infection, recurrence of chronic  pain have been discussed and understood.  He is now brought to the operating  room for this procedure.   DESCRIPTION OF PROCEDURE:  Patient brought to the operating room and placed  in supine position on the operating table and laryngeal mask general  anesthesia was induced.  He received preoperative antibiotics.  The right  groin was widely sterilely prepped and draped.  Correct patient and  procedure were verified.  An oblique incision was made in the right groin  and dissection carried down through subcutaneous tissue.  The external  obliques were identified and divided along the line of its fibers to the  external ring.  The cord was dissected up off the floor of the pubic  tubercle.  The ilioinguinal nerve was identified and protected.  Cremasteric  fibers were divided bilaterally up to the internal ring completely freeing  the cord.  The floor was intact.  There was a dilated internal ring.  Dissection in the cord revealed a fairly large mass of tissue herniating  through  the internal ring.  This was dissected away from the cord as much as  possible.  This appeared to be a large portion of retroperitoneal fat  without a peritoneal sac.  There may have been retroperitoneal organ  associated with this.  This was dissected away from cord structures as much  as possible and then reduced back through the internal ring.  The internal  ring was then closed medially with a running 2-0 Prolene closing it back to  fingertip with the hernia content reduced.  A piece of Parietex mesh was  then trimmed to size to fit the floor of the anal canal with tails going  around the cord at the internal ring.  The mesh was sutured to the pubic  tubercle and then to the inguinal ligament with running 2-0 Prolene working  medial to lateral.  Medially the mesh was sutured to edge of the rectus  sheath with interrupted 2-0 Prolene.  The tails were then tacked together  lateral to the cord with interrupted 2-0 Prolene, creating a snug internal  ring.  Following this, all soft tissue was infiltrated with Marcaine.  The  cord and ilioinguinal nerves were returned to the anatomic position.  The  external oblique was closed over this with running 3-0 Vicryl.  Scarpa's  fascia was closed with running 3-0 Vicryl.  The skin was closed with running  subcuticular 4-0 Monocryl and Steri-Strips.  Sponge, needle and instrument  counts were correct,  Dry sterile dressings were applied.  The patient taken  to recovery in good condition.      Lorne Skeens. Hoxworth, M.D.  Electronically Signed     BTH/MEDQ  D:  11/02/2005  T:  11/02/2005  Job:  [id number]

## 2010-12-27 ENCOUNTER — Other Ambulatory Visit: Payer: Self-pay | Admitting: Endocrinology

## 2011-03-14 ENCOUNTER — Other Ambulatory Visit: Payer: Self-pay | Admitting: *Deleted

## 2011-03-14 MED ORDER — METOPROLOL SUCCINATE ER 50 MG PO TB24: 50.0000 mg | ORAL_TABLET | Freq: Every day | ORAL | Status: DC

## 2011-03-14 NOTE — Telephone Encounter (Signed)
R'cd fax from [facility] for refill of Metoprolol  Last OV-10/03/2010  Last filled-02/16/2011

## 2011-04-15 ENCOUNTER — Other Ambulatory Visit: Payer: Self-pay | Admitting: Endocrinology

## 2011-04-17 ENCOUNTER — Other Ambulatory Visit: Payer: Self-pay | Admitting: *Deleted

## 2011-04-17 MED ORDER — LISINOPRIL 5 MG PO TABS: 5.0000 mg | ORAL_TABLET | Freq: Every day | ORAL | Status: DC

## 2011-08-25 ENCOUNTER — Other Ambulatory Visit: Payer: Self-pay | Admitting: Endocrinology

## 2011-09-15 ENCOUNTER — Other Ambulatory Visit: Payer: Self-pay | Admitting: Endocrinology

## 2011-10-18 ENCOUNTER — Other Ambulatory Visit: Payer: Self-pay | Admitting: *Deleted

## 2011-10-18 MED ORDER — LISINOPRIL 5 MG PO TABS: 5.0000 mg | ORAL_TABLET | Freq: Every day | ORAL | Status: DC

## 2011-11-21 ENCOUNTER — Other Ambulatory Visit: Payer: Self-pay | Admitting: Cardiology

## 2011-11-23 ENCOUNTER — Other Ambulatory Visit: Payer: Self-pay | Admitting: Cardiology

## 2011-12-31 ENCOUNTER — Other Ambulatory Visit: Payer: Self-pay | Admitting: Endocrinology

## 2012-01-28 ENCOUNTER — Other Ambulatory Visit: Payer: Self-pay | Admitting: Endocrinology

## 2012-03-09 ENCOUNTER — Other Ambulatory Visit: Payer: Self-pay | Admitting: Endocrinology

## 2012-04-09 ENCOUNTER — Other Ambulatory Visit: Payer: Self-pay | Admitting: Endocrinology

## 2012-05-12 ENCOUNTER — Other Ambulatory Visit: Payer: Self-pay | Admitting: Endocrinology

## 2012-05-14 NOTE — Telephone Encounter (Signed)
Refill request for TOPROL XL. Pt called on 05/14/12 and stated he has no insurance at this time and cannot make an appt. Pt last seen on 07/04/10

## 2012-05-14 NOTE — Telephone Encounter (Signed)
Please refill x 1 please call patient: Ov is due

## 2012-06-18 ENCOUNTER — Other Ambulatory Visit: Payer: Self-pay | Admitting: Endocrinology

## 2012-07-22 ENCOUNTER — Telehealth: Payer: Self-pay

## 2012-07-22 NOTE — Telephone Encounter (Signed)
Pt called requesting a rx refill for Metoprolol, pt was advised that since he as not been seen since 2012 he needs to make an appointment, pt states he does not have a job or ins at this time. Pt was advised that he could call his cardiologist since Dr. Everardo All is out of the office, verified this with Dr. Elvera Lennox, pt states an understanding.

## 2014-04-21 ENCOUNTER — Encounter: Payer: Self-pay | Admitting: Gastroenterology

## 2014-08-18 ENCOUNTER — Encounter: Payer: Self-pay | Admitting: *Deleted

## 2014-09-04 ENCOUNTER — Encounter: Payer: Self-pay | Admitting: Gastroenterology

## 2017-06-28 ENCOUNTER — Encounter (HOSPITAL_COMMUNITY): Payer: Self-pay

## 2017-06-28 ENCOUNTER — Emergency Department (HOSPITAL_COMMUNITY): Payer: Self-pay

## 2017-06-28 ENCOUNTER — Inpatient Hospital Stay (HOSPITAL_COMMUNITY)
Admission: EM | Admit: 2017-06-28 | Discharge: 2017-07-02 | DRG: 922 | Disposition: A | Discharge status: Home Health | Payer: Self-pay | Attending: Internal Medicine | Admitting: Internal Medicine

## 2017-06-28 DIAGNOSIS — E871 Hypo-osmolality and hyponatremia: Secondary | ICD-10-CM | POA: Diagnosis present

## 2017-06-28 DIAGNOSIS — S065XAA Traumatic subdural hemorrhage with loss of consciousness status unknown, initial encounter: Secondary | ICD-10-CM

## 2017-06-28 DIAGNOSIS — F17200 Nicotine dependence, unspecified, uncomplicated: Secondary | ICD-10-CM | POA: Diagnosis present

## 2017-06-28 DIAGNOSIS — M6282 Rhabdomyolysis: Secondary | ICD-10-CM

## 2017-06-28 DIAGNOSIS — M47812 Spondylosis without myelopathy or radiculopathy, cervical region: Secondary | ICD-10-CM | POA: Diagnosis present

## 2017-06-28 DIAGNOSIS — E890 Postprocedural hypothyroidism: Secondary | ICD-10-CM | POA: Diagnosis present

## 2017-06-28 DIAGNOSIS — I4891 Unspecified atrial fibrillation: Secondary | ICD-10-CM | POA: Diagnosis present

## 2017-06-28 DIAGNOSIS — S140XXA Concussion and edema of cervical spinal cord, initial encounter: Secondary | ICD-10-CM | POA: Diagnosis present

## 2017-06-28 DIAGNOSIS — E039 Hypothyroidism, unspecified: Secondary | ICD-10-CM | POA: Diagnosis present

## 2017-06-28 DIAGNOSIS — W19XXXA Unspecified fall, initial encounter: Secondary | ICD-10-CM | POA: Diagnosis present

## 2017-06-28 DIAGNOSIS — I429 Cardiomyopathy, unspecified: Secondary | ICD-10-CM | POA: Diagnosis present

## 2017-06-28 DIAGNOSIS — Z8249 Family history of ischemic heart disease and other diseases of the circulatory system: Secondary | ICD-10-CM

## 2017-06-28 DIAGNOSIS — R001 Bradycardia, unspecified: Secondary | ICD-10-CM | POA: Diagnosis present

## 2017-06-28 DIAGNOSIS — G9340 Encephalopathy, unspecified: Secondary | ICD-10-CM

## 2017-06-28 DIAGNOSIS — Y92521 Bus station as the place of occurrence of the external cause: Secondary | ICD-10-CM

## 2017-06-28 DIAGNOSIS — S1083XA Contusion of other specified part of neck, initial encounter: Secondary | ICD-10-CM | POA: Diagnosis present

## 2017-06-28 DIAGNOSIS — J449 Chronic obstructive pulmonary disease, unspecified: Secondary | ICD-10-CM | POA: Diagnosis present

## 2017-06-28 DIAGNOSIS — S065X9A Traumatic subdural hemorrhage with loss of consciousness of unspecified duration, initial encounter: Secondary | ICD-10-CM | POA: Diagnosis present

## 2017-06-28 DIAGNOSIS — Z23 Encounter for immunization: Secondary | ICD-10-CM

## 2017-06-28 DIAGNOSIS — I959 Hypotension, unspecified: Secondary | ICD-10-CM | POA: Diagnosis present

## 2017-06-28 DIAGNOSIS — I10 Essential (primary) hypertension: Secondary | ICD-10-CM | POA: Diagnosis present

## 2017-06-28 DIAGNOSIS — T68XXXA Hypothermia, initial encounter: Principal | ICD-10-CM | POA: Diagnosis present

## 2017-06-28 DIAGNOSIS — E861 Hypovolemia: Secondary | ICD-10-CM | POA: Diagnosis present

## 2017-06-28 DIAGNOSIS — I6200 Nontraumatic subdural hemorrhage, unspecified: Secondary | ICD-10-CM

## 2017-06-28 DIAGNOSIS — K219 Gastro-esophageal reflux disease without esophagitis: Secondary | ICD-10-CM | POA: Diagnosis present

## 2017-06-28 DIAGNOSIS — X31XXXA Exposure to excessive natural cold, initial encounter: Secondary | ICD-10-CM

## 2017-06-28 DIAGNOSIS — E035 Myxedema coma: Secondary | ICD-10-CM

## 2017-06-28 LAB — COMPREHENSIVE METABOLIC PANEL
ALT: 36 U/L (ref 17–63)
ANION GAP: 10 (ref 5–15)
AST: 41 U/L (ref 15–41)
Albumin: 3.7 g/dL (ref 3.5–5.0)
Alkaline Phosphatase: 51 U/L (ref 38–126)
BILIRUBIN TOTAL: 1.4 mg/dL — AB (ref 0.3–1.2)
BUN: 19 mg/dL (ref 6–20)
CO2: 24 mmol/L (ref 22–32)
Calcium: 8.7 mg/dL — ABNORMAL LOW (ref 8.9–10.3)
Chloride: 100 mmol/L — ABNORMAL LOW (ref 101–111)
Creatinine, Ser: 0.99 mg/dL (ref 0.61–1.24)
GFR calc Af Amer: >60 mL/min (ref >60)
Glucose, Bld: 107 mg/dL — ABNORMAL HIGH (ref 65–99)
POTASSIUM: 3.7 mmol/L (ref 3.5–5.1)
Sodium: 134 mmol/L — ABNORMAL LOW (ref 135–145)
TOTAL PROTEIN: 6.6 g/dL (ref 6.5–8.1)

## 2017-06-28 LAB — CBC
HCT: 29.2 % — ABNORMAL LOW (ref 39.0–52.0)
Hemoglobin: 9.7 g/dL — ABNORMAL LOW (ref 13.0–17.0)
MCH: 30.7 pg (ref 26.0–34.0)
MCHC: 33.2 g/dL (ref 30.0–36.0)
MCV: 92.4 fL (ref 78.0–100.0)
PLATELETS: 108 10*3/uL — AB (ref 150–400)
RBC: 3.16 MIL/uL — ABNORMAL LOW (ref 4.22–5.81)
RDW: 15.2 % (ref 11.5–15.5)
WBC: 5.6 10*3/uL (ref 4.0–10.5)

## 2017-06-28 LAB — CBC WITH DIFFERENTIAL/PLATELET
BASOS ABS: 0 10*3/uL (ref 0.0–0.1)
BASOS PCT: 0 %
EOS PCT: 1 %
Eosinophils Absolute: 0.1 10*3/uL (ref 0.0–0.7)
HCT: 32.1 % — ABNORMAL LOW (ref 39.0–52.0)
Hemoglobin: 10.7 g/dL — ABNORMAL LOW (ref 13.0–17.0)
LYMPHS PCT: 21 %
Lymphs Abs: 1.1 10*3/uL (ref 0.7–4.0)
MCH: 31 pg (ref 26.0–34.0)
MCHC: 33.3 g/dL (ref 30.0–36.0)
MCV: 93 fL (ref 78.0–100.0)
Monocytes Absolute: 0.2 10*3/uL (ref 0.1–1.0)
Monocytes Relative: 3 %
NEUTROS ABS: 3.9 10*3/uL (ref 1.7–7.7)
Neutrophils Relative %: 75 %
PLATELETS: 103 10*3/uL — AB (ref 150–400)
RBC: 3.45 MIL/uL — AB (ref 4.22–5.81)
RDW: 15.1 % (ref 11.5–15.5)
WBC: 5.3 10*3/uL (ref 4.0–10.5)

## 2017-06-28 LAB — TSH: TSH: 31.18 u[IU]/mL — ABNORMAL HIGH (ref 0.350–4.500)

## 2017-06-28 LAB — I-STAT TROPONIN, ED: Troponin i, poc: 0 ng/mL (ref 0.00–0.08)

## 2017-06-28 LAB — CREATININE, SERUM
CREATININE: 0.99 mg/dL (ref 0.61–1.24)
GFR calc Af Amer: >60 mL/min (ref >60)
GFR calc non Af Amer: >60 mL/min (ref >60)

## 2017-06-28 LAB — CBG MONITORING, ED: Glucose-Capillary: 73 mg/dL (ref 65–99)

## 2017-06-28 LAB — PROTIME-INR
INR: 0.97
PROTHROMBIN TIME: 12.8 s (ref 11.4–15.2)

## 2017-06-28 LAB — MRSA PCR SCREENING: MRSA by PCR: NEGATIVE

## 2017-06-28 LAB — CORTISOL: Cortisol, Plasma: 57.8 ug/dL

## 2017-06-28 LAB — PROCALCITONIN

## 2017-06-28 LAB — GLUCOSE, CAPILLARY: Glucose-Capillary: 88 mg/dL (ref 65–99)

## 2017-06-28 LAB — T4, FREE

## 2017-06-28 LAB — CK: CK TOTAL: 1495 U/L — AB (ref 49–397)

## 2017-06-28 LAB — I-STAT CG4 LACTIC ACID, ED
Lactic Acid, Venous: 1.03 mmol/L (ref 0.5–1.9)
Lactic Acid, Venous: 0.61 mmol/L (ref 0.5–1.9)

## 2017-06-28 MED ORDER — DEXTROSE 50 % IV SOLN
INTRAVENOUS | Status: AC
Start: 2017-06-28 — End: 2017-06-28
  Administered 2017-06-28: 25 mL
  Filled 2017-06-28: qty 50

## 2017-06-28 MED ORDER — PIPERACILLIN-TAZOBACTAM 3.375 G IVPB
3.3750 g | Freq: Three times a day (TID) | INTRAVENOUS | Status: DC
  Administered 2017-06-29: 3.375 g via INTRAVENOUS
  Filled 2017-06-28 (×2): qty 50

## 2017-06-28 MED ORDER — INFLUENZA VAC SPLIT QUAD 0.5 ML IM SUSY
0.5000 mL | PREFILLED_SYRINGE | INTRAMUSCULAR | Status: AC
  Administered 2017-06-29: 0.5 mL via INTRAMUSCULAR
  Filled 2017-06-28: qty 0.5

## 2017-06-28 MED ORDER — TETANUS-DIPHTH-ACELL PERTUSSIS 5-2.5-18.5 LF-MCG/0.5 IM SUSP
0.5000 mL | Freq: Once | INTRAMUSCULAR | Status: AC
  Administered 2017-06-28: 0.5 mL via INTRAMUSCULAR
  Filled 2017-06-28: qty 0.5

## 2017-06-28 MED ORDER — SODIUM CHLORIDE 0.9 % IV BOLUS (SEPSIS)
1000.0000 mL | Freq: Once | INTRAVENOUS | Status: AC
  Administered 2017-06-28: 1000 mL via INTRAVENOUS

## 2017-06-28 MED ORDER — SODIUM CHLORIDE 0.9 % IV BOLUS (SEPSIS)
500.0000 mL | Freq: Once | INTRAVENOUS | Status: AC
  Administered 2017-06-28: 500 mL via INTRAVENOUS

## 2017-06-28 MED ORDER — LACTATED RINGERS IV SOLN
INTRAVENOUS | Status: DC
  Administered 2017-06-28: 18:00:00 via INTRAVENOUS

## 2017-06-28 MED ORDER — VANCOMYCIN HCL IN DEXTROSE 1-5 GM/200ML-% IV SOLN: 1000.0000 mg | Freq: Once | INTRAVENOUS | Status: DC

## 2017-06-28 MED ORDER — ACETAMINOPHEN 325 MG PO TABS
650.0000 mg | ORAL_TABLET | Freq: Four times a day (QID) | ORAL | Status: DC | PRN
  Administered 2017-06-28 – 2017-06-29 (×2): 650 mg via ORAL
  Filled 2017-06-28 (×2): qty 2

## 2017-06-28 MED ORDER — VANCOMYCIN HCL 10 G IV SOLR
1500.0000 mg | INTRAVENOUS | Status: AC
  Administered 2017-06-28: 1500 mg via INTRAVENOUS
  Filled 2017-06-28: qty 1500

## 2017-06-28 MED ORDER — DEXTROSE 5 % IV SOLN: 500.0000 mg | INTRAVENOUS | Status: DC

## 2017-06-28 MED ORDER — PIPERACILLIN-TAZOBACTAM 3.375 G IVPB: 3.3750 g | Freq: Three times a day (TID) | INTRAVENOUS | Status: DC

## 2017-06-28 MED ORDER — HYDROCORTISONE NA SUCCINATE PF 100 MG IJ SOLR
50.0000 mg | Freq: Four times a day (QID) | INTRAMUSCULAR | Status: DC
Start: 2017-06-28 — End: 2017-06-29
  Administered 2017-06-28 – 2017-06-29 (×2): 50 mg via INTRAVENOUS
  Filled 2017-06-28 (×2): qty 2

## 2017-06-28 MED ORDER — PANTOPRAZOLE SODIUM 40 MG IV SOLR
40.0000 mg | Freq: Every day | INTRAVENOUS | Status: DC
  Administered 2017-06-28: 40 mg via INTRAVENOUS
  Filled 2017-06-28: qty 40

## 2017-06-28 MED ORDER — LIOTHYRONINE SODIUM 10 MCG/ML IV SOLN
10.0000 ug | Freq: Once | INTRAVENOUS | Status: AC
  Administered 2017-06-28: 10 ug via INTRAVENOUS
  Filled 2017-06-28: qty 1

## 2017-06-28 MED ORDER — SODIUM CHLORIDE 0.9 % IV SOLN: 250.0000 mL | INTRAVENOUS | Status: DC | PRN

## 2017-06-28 MED ORDER — NOREPINEPHRINE BITARTRATE 1 MG/ML IV SOLN: 2.0000 ug/kg/min | Freq: Once | INTRAVENOUS | Status: DC

## 2017-06-28 MED ORDER — PIPERACILLIN-TAZOBACTAM 3.375 G IVPB 30 MIN
3.3750 g | Freq: Once | INTRAVENOUS | Status: AC
  Administered 2017-06-28: 3.375 g via INTRAVENOUS
  Filled 2017-06-28: qty 50

## 2017-06-28 MED ORDER — BACITRACIN ZINC 500 UNIT/GM EX OINT
1.0000 "application " | TOPICAL_OINTMENT | Freq: Two times a day (BID) | CUTANEOUS | Status: DC
  Administered 2017-06-29 – 2017-07-02 (×8): 1 via TOPICAL
  Filled 2017-06-28 (×2): qty 28.35

## 2017-06-28 MED ORDER — ENOXAPARIN SODIUM 40 MG/0.4ML ~~LOC~~ SOLN
40.0000 mg | SUBCUTANEOUS | Status: DC
  Administered 2017-06-28: 40 mg via SUBCUTANEOUS
  Filled 2017-06-28 (×2): qty 0.4

## 2017-06-28 MED ORDER — DEXTROSE 5 % IV SOLN
0.0000 ug/min | INTRAVENOUS | Status: DC
  Administered 2017-06-28: 2 ug/min via INTRAVENOUS
  Administered 2017-06-29: 9 ug/min via INTRAVENOUS
  Filled 2017-06-28 (×2): qty 4

## 2017-06-28 MED ORDER — HYDROCORTISONE NA SUCCINATE PF 100 MG IJ SOLR
100.0000 mg | Freq: Once | INTRAMUSCULAR | Status: AC
  Administered 2017-06-28: 100 mg via INTRAVENOUS
  Filled 2017-06-28: qty 2

## 2017-06-28 MED ORDER — PIPERACILLIN-TAZOBACTAM 3.375 G IVPB 30 MIN: 3.3750 g | Freq: Four times a day (QID) | INTRAVENOUS | Status: DC

## 2017-06-28 MED ORDER — VANCOMYCIN HCL IN DEXTROSE 750-5 MG/150ML-% IV SOLN
750.0000 mg | Freq: Three times a day (TID) | INTRAVENOUS | Status: DC
  Administered 2017-06-29: 750 mg via INTRAVENOUS
  Filled 2017-06-28 (×3): qty 150

## 2017-06-28 MED ORDER — LEVOTHYROXINE SODIUM 100 MCG IV SOLR
200.0000 ug | Freq: Once | INTRAVENOUS | Status: AC
  Administered 2017-06-28: 200 ug via INTRAVENOUS
  Filled 2017-06-28: qty 10

## 2017-06-28 NOTE — ED Triage Notes (Signed)
Pt arrived via [facility] from bus stop after falling while attempting to board bus. Pt has laceration to the forehead. Skin tear noted to right forearm but injury did not occur today. Old laceration also noted to the right hand. Hand laceration is quite deep, not actively bleeding but dried blood and dirt noted around wound. Pt is alert and oriented to self and place, but is slow to respond. Pt arrived wearing multiple layers of clothing. Skin dry and cool to touch. Multiple bruising and minor skin tears noted to both arms. Marland Kitchen

## 2017-06-28 NOTE — Progress Notes (Signed)
Patient asked me to contact his work, 5 guys  At [employer] center. To let them know where he was. I called and spoke to the manager Ray. He will let the GM know.

## 2017-06-28 NOTE — Progress Notes (Signed)
Pharmacy Antibiotic Note  Donald Myers is a 61 y.o. male admitted on 06/28/2017 with sepsis.  Pharmacy has been consulted for vancomycin and zosyn dosing. Pt is hypothermic, WBC is WNL and SCr is WNL. Lactic acid is normal.   Plan: Vancomycin 1500mg  IV x 1 then 750mg  IV Q8H Zosyn 3.375gm IV Q8H (4 hr inf) F/u renal fxn, C&S, clinical status and trough at SS  Height: 6\' 1"  (185.4 cm) Weight: 175 lb (79.4 kg) IBW/kg (Calculated) : 79.9  Temp (24hrs), Avg:87.6 F (30.9 C), Min:87.6 F (30.9 C), Max:87.6 F (30.9 C)  No results for input(s): WBC, CREATININE, LATICACIDVEN, VANCOTROUGH, VANCOPEAK, VANCORANDOM, GENTTROUGH, GENTPEAK, GENTRANDOM, TOBRATROUGH, TOBRAPEAK, TOBRARND, AMIKACINPEAK, AMIKACINTROU, AMIKACIN in the last 168 hours.  CrCl cannot be calculated (Patient's most recent lab result is older than the maximum 21 days allowed.).    Allergies no known allergies  Antimicrobials this admission: Vanc 12/6>> Zosyn 12/6>>  Dose adjustments this admission: N/A  Microbiology results: Pending  Thank you for allowing pharmacy to be a part of this patient's care.  Donald Myers, Donald Myers 06/28/2017 9:54 AM

## 2017-06-28 NOTE — ED Provider Notes (Addendum)
[facility] EMERGENCY DEPARTMENT Provider Note   CSN: [id number] Arrival date & time: 06/28/17  9379     History   Chief Complaint Chief Complaint  Patient presents with  . Fall  . Altered Mental Status    HPI Donald Myers is a 61 y.o. male.  61 year old male with past medical history including atrial fibrillation on Coumadin, cardiomyopathy, hypertension, hepatitis C, hypothyroidism who presents with fall and altered mental status.  EMS reports that the patient was outside at a bus stop and when he tried to get on the bus, he fell striking the front of his head.  He was noted to be altered.  Patient himself denies any complaints.  He states he cut his right hand a week ago when he hit it on a coffee table.  He denies any cough.  LEVEL 5 CAVEAT DUE TO AMS   The history is provided by the EMS personnel. The history is limited by the condition of the patient.  Fall   Altered Mental Status      Past Medical History:  Diagnosis Date  . Atrial fibrillation 08/03/2008  . CARDIOMYOPATHY 09/26/2008   EF 35% previously, now NL  . COPD 08/03/2008  . GERD (gastroesophageal reflux disease)   . HEPATITIS C 09/26/2008  . HYPERTENSION 08/03/2008  . Hyperthyroidism   . HYPOTHYROIDISM, POST-RADIATION 08/22/2010  . THROMBOCYTOPENIA 08/03/2008  . Tubular adenoma of colon    2011    Patient Active Problem List   Diagnosis Date Noted  . Hypothermia 06/28/2017  . Long term current use of anticoagulant 09/06/2010  . HYPOTHYROIDISM, POST-RADIATION 08/22/2010  . HEPATITIS C 09/26/2008  . CARDIOMYOPATHY 09/26/2008  . THROMBOCYTOPENIA 08/03/2008  . TOBACCO ABUSE 08/03/2008  . HYPERTENSION 08/03/2008  . ATRIAL FIBRILLATION 08/03/2008  . COPD 08/03/2008     Home Medications    Prior to Admission medications   Medication Sig Start Date End Date Taking? Authorizing Provider  levothyroxine (SYNTHROID, LEVOTHROID) 150 MCG tablet TAKE 1 TABLET EVERY DAY Patient not  taking: Reported on 06/28/2017 12/31/11   Renato Shin, MD  lisinopril (PRINIVIL,ZESTRIL) 5 MG tablet Take 1 tablet (5 mg total) by mouth daily. Patient not taking: Reported on 06/28/2017 10/18/11   Minus Breeding, MD  metoprolol succinate (TOPROL-XL) 50 MG 24 hr tablet TAKE 1 TABLET (50 MG TOTAL) BY MOUTH DAILY. Patient not taking: Reported on 06/28/2017 05/12/12   Renato Shin, MD  warfarin (COUMADIN) 5 MG tablet Take by mouth as directed.      [provider]    Family History Family History  Problem Relation Age of Onset  . Hypertension Mother     Social History Social History   Tobacco Use  . Smoking status: Current Every Day Smoker  Substance Use Topics  . Alcohol use: Yes    Comment: Social  . Drug use: No     Allergies   Patient has no known allergies.   Review of Systems Review of Systems  Unable to perform ROS: Mental status change     Physical Exam Updated Vital Signs BP (!) 81/55   Pulse (!) 48   Temp (!) 88.2 F (31.2 C) (Rectal)   Resp 18   Ht 6\' 1"  (1.854 m)   Wt 79.4 kg (175 lb)   SpO2 94%   BMI 23.09 kg/m   Physical Exam  Constitutional: No distress.  Awake, alert, comfortable  HENT:  Head: Normocephalic.  Abrasion on nasal bridge, poor dentition  Eyes: Conjunctivae are  normal. Pupils are equal, round, and reactive to light.  Mild edema b/l eyelids  Neck: Neck supple.  Cardiovascular: Regular rhythm and normal heart sounds. Bradycardia present.  No murmur heard. Pulmonary/Chest: Effort normal.  Rhonchi bilaterally  Abdominal: Soft. Bowel sounds are normal. He exhibits no distension. There is no tenderness.  Musculoskeletal: He exhibits no edema or deformity.  Ecchymoses b/l knees without tenderness or effusion  Neurological: He is alert.  Oriented to person and place, slowed speech and some confusion during questions, difficulty following commands, moving all 4 extremities equally, no facial asymmetry  Skin: Skin is dry.  Capillary refill takes more than 3 seconds.  Cold extremities; skin tears on dorsal right forearm and distal upper arm; 2 cm deep laceration to lateral right hand just below 5th MCP joint, dirty with some granulation tissue but no drainage or surrounding erythema; scattered ecchymoses of varying ages on arms; dry peeling skin on legs  Psychiatric:  Disheveled appearance, dirty hands and feet  Nursing note and vitals reviewed.    ED Treatments / Results  Labs (all labs ordered are listed, but only abnormal results are displayed) Labs Reviewed  COMPREHENSIVE METABOLIC PANEL - Abnormal; Notable for the following components:      Result Value   Sodium 134 (*)    Chloride 100 (*)    Glucose, Bld 107 (*)    Calcium 8.7 (*)    Total Bilirubin 1.4 (*)    All other components within normal limits  CBC WITH DIFFERENTIAL/PLATELET - Abnormal; Notable for the following components:   RBC 3.45 (*)    Hemoglobin 10.7 (*)    HCT 32.1 (*)    Platelets 103 (*)    All other components within normal limits  CK - Abnormal; Notable for the following components:   Total CK 1,495 (*)    All other components within normal limits  TSH - Abnormal; Notable for the following components:   TSH 31.180 (*)    All other components within normal limits  T4, FREE - Abnormal; Notable for the following components:   Free T4 <0.25 (*)    All other components within normal limits  CULTURE, BLOOD (ROUTINE X 2)  CULTURE, BLOOD (ROUTINE X 2)  URINE CULTURE  PROTIME-INR  CORTISOL  URINALYSIS, ROUTINE W REFLEX MICROSCOPIC  PROCALCITONIN  HIV ANTIBODY (ROUTINE TESTING)  CBC  CREATININE, SERUM  I-STAT CG4 LACTIC ACID, ED  I-STAT TROPONIN, ED  I-STAT CG4 LACTIC ACID, ED  CBG MONITORING, ED    EKG  EKG Interpretation  Date/Time:  Thursday June 28 2017 09:10:28 EST Ventricular Rate:  39 PR Interval:    QRS Duration: 141 QT Interval:  590 QTC Calculation: 476 R Axis:   34 Text Interpretation:   Junctional rhythm Nonspecific intraventricular conduction delay Anteroseptal infarct, age indeterminate bradycardia new from previous, difficult to see P waves, possible slow A fib Confirmed by Theotis Burrow [phone]) on 06/28/2017 9:34:17 AM       Radiology Dg Chest Port 1 View  Result Date: 06/28/2017 CLINICAL DATA:  Altered mental status, hypothermia. History of COPD, atrial fibrillation, and cardiomyopathy. Current smoker. EXAM: PORTABLE CHEST 1 VIEW COMPARISON:  PA and lateral chest x-ray of November 08, 2009 FINDINGS: The lungs are mildly hypoinflated. There are increased lung markings in the infrahilar regions bilaterally. There is no pleural effusion or pneumothorax. The heart is top-normal in size. The pulmonary vascularity is normal. There is calcification in the wall of the aortic arch. An external pacemaker defibrillator pad  is present. IMPRESSION: Bibasilar atelectasis or pneumonia. No CHF. Follow-up PA and lateral chest X-ray is recommended in 3-4 weeks following trial of antibiotic therapy to ensure resolution and exclude underlying malignancy. Thoracic aortic atherosclerosis. Electronically Signed   By: David  Martinique M.D.   On: 06/28/2017 09:50    Procedures .Critical Care Performed by: Sharlett Iles, MD Authorized by: Sharlett Iles, MD   Critical care provider statement:    Critical care time (minutes):  75   Critical care time was exclusive of:  Separately billable procedures and treating other patients   Critical care was necessary to treat or prevent imminent or life-threatening deterioration of the following conditions:  Endocrine crisis and CNS failure or compromise   Critical care was time spent personally by me on the following activities:  Development of treatment plan with patient or surrogate, discussions with consultants, evaluation of patient's response to treatment, examination of patient, obtaining history from patient or surrogate, ordering and  performing treatments and interventions, ordering and review of laboratory studies, ordering and review of radiographic studies, re-evaluation of patient's condition and review of old charts   (including critical care time)  Medications Ordered in ED Medications  bacitracin ointment 1 application (not administered)  vancomycin (VANCOCIN) IVPB 750 mg/150 ml premix (not administered)  norepinephrine (LEVOPHED) 4 mg in dextrose 5 % 250 mL (0.016 mg/mL) infusion (2 mcg/min Intravenous New Bag/Given 06/28/17 1617)  sodium chloride 0.9 % bolus 1,000 mL (not administered)  piperacillin-tazobactam (ZOSYN) IVPB 3.375 g (not administered)  azithromycin (ZITHROMAX) 500 mg in dextrose 5 % 250 mL IVPB (not administered)  hydrocortisone sodium succinate (SOLU-CORTEF) 100 MG injection 50 mg (not administered)  0.9 %  sodium chloride infusion (not administered)  enoxaparin (LOVENOX) injection 40 mg (not administered)  pantoprazole (PROTONIX) injection 40 mg (not administered)  lactated ringers infusion (not administered)  sodium chloride 0.9 % bolus 1,000 mL (0 mLs Intravenous Stopped 06/28/17 1045)    And  sodium chloride 0.9 % bolus 1,000 mL (0 mLs Intravenous Stopped 06/28/17 1045)    And  sodium chloride 0.9 % bolus 500 mL (0 mLs Intravenous Stopped 06/28/17 1136)  piperacillin-tazobactam (ZOSYN) IVPB 3.375 g (0 g Intravenous Stopped 06/28/17 1037)  Tdap (BOOSTRIX) injection 0.5 mL (0.5 mLs Intramuscular Given 06/28/17 0957)  vancomycin (VANCOCIN) 1,500 mg in sodium chloride 0.9 % 500 mL IVPB (0 mg Intravenous Stopped 06/28/17 1319)  dextrose 50 % solution (25 mLs  Given 06/28/17 1254)  levothyroxine (SYNTHROID, LEVOTHROID) injection 200 mcg (200 mcg Intravenous Given 06/28/17 1457)  hydrocortisone sodium succinate (SOLU-CORTEF) 100 MG injection 100 mg (100 mg Intravenous Given 06/28/17 1324)  Liothyronine Sodium SOLN 10 mcg (10 mcg Intravenous Given 06/28/17 1520)     Initial Impression / Assessment and  Plan / ED Course  I have reviewed the triage vital signs and the nursing notes.  Pertinent labs & imaging results that were available during my care of the patient were reviewed by me and considered in my medical decision making (see chart for details).    PT brought from bus stop in <40 degree weather w/ AMS and fall. T 87.6 rectally, HR 39, BP stable. Breathing comfortably and protecting airway.  Although confused, he was awake and alert. Initiated code sepsis w/ cultures, Lucianne Lei and Zosyn, warm IVF. Also applied [employer], heat packs, and blankets. Gave tdap as chart states last was 2008.   Show sodium 134, normal WBC count, hemoglobin 10.7 without any recent for comparison, normal LFTs, CK 1495.  His INR is normal which suggests he is no longer taking Coumadin.  I noted on his chart that he has a history of hypothyroidism.  Given his bradycardia, hypothermia, mild hyponatremia, and confusion I am concerned about the possibility of myxedema coma.  Added thyroid studies and gave IV levothyroxine and levothyronine with hydrocortisone.   Chest x-ray with bibasilar atelectasis versus pneumonia.  Still awaiting urine for UA.  Gave another fluid bolus as his pressure began dropping. Eventually initiated levophed drip.  His head CT is pending.  He continues to be arousable to voice and will answer a few basic questions although still confused.  He is still protecting his airway.  I have discussed with critical care, Dr.Gray. Pt admitted to [facility] for further treatment. Final Clinical Impressions(s) / ED Diagnoses   Final diagnoses:  None    ED Discharge Orders    None       Signora Zucco, Wenda Overland, MD 06/28/17 1649    Tuere Nwosu, Wenda Overland, MD 06/28/17 1649

## 2017-06-28 NOTE — ED Notes (Signed)
Checked patient cbg it was 70 notified RN of blood sugar patient  is resting with call bell in reach

## 2017-06-28 NOTE — ED Notes (Signed)
Patient is resting with call bell in reach  

## 2017-06-28 NOTE — H&P (Signed)
PULMONARY / CRITICAL CARE MEDICINE   Name: Donald Myers MRN: [id number] DOB: 11/18/1955    ADMISSION DATE:  06/28/2017  CHIEF COMPLAINT: Incidental finding of extreme hypothermia  HISTORY OF PRESENT ILLNESS:   Donald Myers is brought to [place] after he fell down trying to get on a bus earlier today.  He was found to be bradycardic hypotensive and extremely hypothermic.  He does have a history of hypothyroidism and claims that he takes his medicine but his TSH in the emergency room is 31.  He denies any fevers chills or sweats, he denies any cough, he denies dysuria, he denies abdominal pain.  He denies having been standing at the bus stop for an extended period of time this morning I should note it is unusually cold in [place] today.  He has received several liters of crystalloid in the department of emergency medicine, an intravenous dose of T4 and T3, and empiric antibiotics.  At the time of my examination he is interactive and has a blood pressure in the 1 teens.  PAST MEDICAL HISTORY :  He  has a past medical history of Atrial fibrillation (08/03/2008), CARDIOMYOPATHY (09/26/2008), COPD (08/03/2008), GERD (gastroesophageal reflux disease), HEPATITIS C (09/26/2008), HYPERTENSION (08/03/2008), Hyperthyroidism, HYPOTHYROIDISM, POST-RADIATION (08/22/2010), THROMBOCYTOPENIA (08/03/2008), and Tubular adenoma of colon.  PAST SURGICAL HISTORY: He  has a past surgical history that includes Hernia repair.  No Known Allergies  No current facility-administered medications on file prior to encounter.    Current Outpatient Medications on File Prior to Encounter  Medication Sig  . levothyroxine (SYNTHROID, LEVOTHROID) 150 MCG tablet TAKE 1 TABLET EVERY DAY (Patient not taking: Reported on 06/28/2017)  . lisinopril (PRINIVIL,ZESTRIL) 5 MG tablet Take 1 tablet (5 mg total) by mouth daily. (Patient not taking: Reported on 06/28/2017)  . metoprolol succinate (TOPROL-XL) 50 MG 24 hr tablet TAKE 1 TABLET (50 MG  TOTAL) BY MOUTH DAILY. (Patient not taking: Reported on 06/28/2017)  . warfarin (COUMADIN) 5 MG tablet Take by mouth as directed.      FAMILY HISTORY:  His indicated that his father is deceased. He indicated that his brother is alive.   SOCIAL HISTORY: He  reports that he has been smoking.  He does not have any smokeless tobacco history on file. He reports that he drinks alcohol. He reports that he does not use drugs.  REVIEW OF SYSTEMS:   He tells me that he normally ambulates ad lib.  He denies history of stroke or TIA symptoms.  He denies a history of psychiatric illness specifically denying hallucinations, depression, or disorganized thinking.  He denies a history of respiratory illness specifically no known history of TB chronic cough or hemoptysis.  He is not aware of any history of cardiac disease despite the diagnoses listed in the chart he denies a history of chest pain.  He is not aware that he has had hepatitis in the past although that is a diagnosis also listed in his chart.  The remainder of a 10 system review of systems is really not contributory  SUBJECTIVE:  As above  VITAL SIGNS: BP (!) 81/55   Pulse (!) 48   Temp (!) 88.2 F (31.2 C) (Rectal)   Resp 18   Ht 6\' 1"  (1.854 m)   Wt 175 lb (79.4 kg)   SpO2 94%   BMI 23.09 kg/m       PHYSICAL EXAMINATION: General: He is appropriately interactive for me.  He has a small laceration just above his nose. Neuro: He  is oriented x3 except that he is off by 2 days on the date.  He is appropriately conversant with me.  Pupils are equal and EOMs are full.  He moves all fours on request.  He is areflexic. HEENT: Is a small laceration above the nose but no bony deformity. Cardiovascular: S1 and S2 are somewhat distant slow and regular without murmur rub or gallop.  He does not have dependent edema. Lungs: Respirations are unlabored there is symmetric air movement no wheezes, the lungs are clear to supine exam. Abdomen: Is soft  and nontender without any overt organomegaly masses tenderness guarding or rebound.  I do not appreciate any icterus    LABS:  BMET Recent Labs  Lab 06/28/17 0948  NA 134*  K 3.7  CL 100*  CO2 24  BUN 19  CREATININE 0.99  GLUCOSE 107*    Electrolytes Recent Labs  Lab 06/28/17 0948  CALCIUM 8.7*    CBC Recent Labs  Lab 06/28/17 0948  WBC 5.3  HGB 10.7*  HCT 32.1*  PLT 103*    Coag's Recent Labs  Lab 06/28/17 0948  INR 0.97    Sepsis Markers Recent Labs  Lab 06/28/17 1010 06/28/17 1201  LATICACIDVEN 1.03 0.61    ABG No results for input(s): PHART, PCO2ART, PO2ART in the last 168 hours.  Liver Enzymes Recent Labs  Lab 06/28/17 0948  AST 41  ALT 36  ALKPHOS 51  BILITOT 1.4*  ALBUMIN 3.7    Cardiac Enzymes No results for input(s): TROPONINI, PROBNP in the last 168 hours.  Glucose Recent Labs  Lab 06/28/17 1235  GLUCAP 73    Imaging Dg Chest Port 1 View  Result Date: 06/28/2017 CLINICAL DATA:  Altered mental status, hypothermia. History of COPD, atrial fibrillation, and cardiomyopathy. Current smoker. EXAM: PORTABLE CHEST 1 VIEW COMPARISON:  PA and lateral chest x-ray of November 08, 2009 FINDINGS: The lungs are mildly hypoinflated. There are increased lung markings in the infrahilar regions bilaterally. There is no pleural effusion or pneumothorax. The heart is top-normal in size. The pulmonary vascularity is normal. There is calcification in the wall of the aortic arch. An external pacemaker defibrillator pad is present. IMPRESSION: Bibasilar atelectasis or pneumonia. No CHF. Follow-up PA and lateral chest X-ray is recommended in 3-4 weeks following trial of antibiotic therapy to ensure resolution and exclude underlying malignancy. Thoracic aortic atherosclerosis. Electronically Signed   By: David  Martinique M.D.   On: 06/28/2017 09:50     DISCUSSION: This is a 61 year old with a history of cardiomyopathy and hypothyroidism who presented to the  emergency room after a minor trauma with profoundly hypotensive and hypothermic.  It is not clear to me what portion of his hypothermia is due to exposure on this very cold day, what component is due to hypothyroidism, and potentially what component is due to infection.  He has been started on thyroid replacement, I have placed him on stress dose steroids, and empirically treated him with antibiotics.  His blood pressures come up already with the distal Earnie Larsson that he has received.  I will be admitting him to the intensive care unit for monitoring in the event the pressors are required.    ASSESSMENT / PLAN:  Pulmonary A: His chest x-ray in fact suggests a right basilar infiltrate.  He has been started on a combination of Zosyn and azithromycin to cover for potential aspiration or community-acquired pneumonia.    CARDIOVASCULAR A: Hypotension has responded to fluids as noted.  Is been  started on stress dose steroids because of the possibility of underlying sepsis as well as the possibility of concurrent adrenal insufficiency.  I am not anxious to aggressively replace his thyroid hormone as he has an EKG which suggests an old infarct and I do not want to acutely precipitate ischemia.   GASTROINTESTINAL A: The chart reports a history of hepatitis C although he is not aware that diagnosis   INFECTIOUS A: Lactic acid is negative, pro calcitonin is pending, he is being empirically treated for potential infection primarily targeting aspiration.     ENDOCRINE A: He has received an initial corrective dose of both T4 and T3, further therapy will be titrated based on response     NEUROLOGIC A: CT scan of the head is pending  Greater than 32 minutes was spent in the care of this patient today   Lars Masson, [place] Pager: [phone]  06/28/2017, 4:48 PM

## 2017-06-28 NOTE — Progress Notes (Signed)
[facility] Progress Note Patient Name: Donald Myers DOB: 09-30-1955 MRN: [id number]   Date of Service  06/28/2017  HPI/Events of Note  Patient complaining of pain to nurse. Still on low-dose Levophed infusion. Subdurals noted.   eICU Interventions  Tylenol ordered as needed for pain control      Intervention Category Intermediate Interventions: Pain - evaluation and management  Tera Partridge 06/28/2017, 8:22 PM

## 2017-06-28 NOTE — ED Notes (Signed)
Abx scanned prior to drawing bloodwork but started after blood was drawn for labs and cultures.

## 2017-06-28 NOTE — Progress Notes (Signed)
Oncoming nurse aware of patient belongings and the amount of money in his wallet (~$400).  Oncoming nurse agreed to place wallet with security until discharge.

## 2017-06-29 ENCOUNTER — Encounter (HOSPITAL_COMMUNITY): Payer: Self-pay

## 2017-06-29 ENCOUNTER — Inpatient Hospital Stay (HOSPITAL_COMMUNITY): Payer: Self-pay

## 2017-06-29 ENCOUNTER — Other Ambulatory Visit: Payer: Self-pay

## 2017-06-29 DIAGNOSIS — E039 Hypothyroidism, unspecified: Secondary | ICD-10-CM

## 2017-06-29 DIAGNOSIS — S065X9A Traumatic subdural hemorrhage with loss of consciousness of unspecified duration, initial encounter: Secondary | ICD-10-CM

## 2017-06-29 LAB — COMPREHENSIVE METABOLIC PANEL
ALK PHOS: 48 U/L (ref 38–126)
ALT: 31 U/L (ref 17–63)
AST: 31 U/L (ref 15–41)
Albumin: 3.3 g/dL — ABNORMAL LOW (ref 3.5–5.0)
Anion gap: 11 (ref 5–15)
BILIRUBIN TOTAL: 1.4 mg/dL — AB (ref 0.3–1.2)
BUN: 15 mg/dL (ref 6–20)
CALCIUM: 8.2 mg/dL — AB (ref 8.9–10.3)
CHLORIDE: 100 mmol/L — AB (ref 101–111)
CO2: 21 mmol/L — ABNORMAL LOW (ref 22–32)
CREATININE: 1.11 mg/dL (ref 0.61–1.24)
Glucose, Bld: 106 mg/dL — ABNORMAL HIGH (ref 65–99)
Potassium: 3.7 mmol/L (ref 3.5–5.1)
Sodium: 132 mmol/L — ABNORMAL LOW (ref 135–145)
TOTAL PROTEIN: 6.5 g/dL (ref 6.5–8.1)

## 2017-06-29 LAB — URINALYSIS, ROUTINE W REFLEX MICROSCOPIC
BILIRUBIN URINE: NEGATIVE
Glucose, UA: NEGATIVE mg/dL
KETONES UR: 5 mg/dL — AB
Leukocytes, UA: NEGATIVE
Nitrite: NEGATIVE
PH: 5 (ref 5.0–8.0)
Protein, ur: 100 mg/dL — AB
SQUAMOUS EPITHELIAL / LPF: NONE SEEN
Specific Gravity, Urine: 1.018 (ref 1.005–1.030)

## 2017-06-29 LAB — CBC
HCT: 29.8 % — ABNORMAL LOW (ref 39.0–52.0)
Hemoglobin: 9.9 g/dL — ABNORMAL LOW (ref 13.0–17.0)
MCH: 30.9 pg (ref 26.0–34.0)
MCHC: 33.2 g/dL (ref 30.0–36.0)
MCV: 93.1 fL (ref 78.0–100.0)
PLATELETS: 126 10*3/uL — AB (ref 150–400)
RBC: 3.2 MIL/uL — ABNORMAL LOW (ref 4.22–5.81)
RDW: 15.2 % (ref 11.5–15.5)
WBC: 7.7 10*3/uL (ref 4.0–10.5)

## 2017-06-29 LAB — HIV ANTIBODY (ROUTINE TESTING W REFLEX): HIV Screen 4th Generation wRfx: NONREACTIVE

## 2017-06-29 LAB — PROCALCITONIN: Procalcitonin: <0.1 ng/mL

## 2017-06-29 LAB — MAGNESIUM: Magnesium: 1.6 mg/dL — ABNORMAL LOW (ref 1.7–2.4)

## 2017-06-29 LAB — PHOSPHORUS: Phosphorus: 3.3 mg/dL (ref 2.5–4.6)

## 2017-06-29 LAB — CK: CK TOTAL: 990 U/L — AB (ref 49–397)

## 2017-06-29 MED ORDER — GADOBENATE DIMEGLUMINE 529 MG/ML IV SOLN
20.0000 mL | Freq: Once | INTRAVENOUS | Status: AC
  Administered 2017-06-29: 20 mL via INTRAVENOUS

## 2017-06-29 MED ORDER — LEVOTHYROXINE SODIUM 100 MCG PO TABS
100.0000 ug | ORAL_TABLET | Freq: Every day | ORAL | Status: DC
  Administered 2017-06-29 – 2017-07-02 (×4): 100 ug via ORAL
  Filled 2017-06-29 (×4): qty 1

## 2017-06-29 NOTE — Care Management Note (Addendum)
Case Management Note  Patient Details  Name: ARAN MENNING MRN: [id number] Date of Birth: [birth year]  Subjective/Objective:  From home,presents with hypothermia, bradycardia, hypotension, has no PCP , no insurance.  NCM scheduled follow up apt with [facility] on 12/26 at 11 am, since he is going to this clinice to establish care he can utilize the [facility] for medication ast.  NCM gave patient brochures for the [facility] and wellness clinic and the [facility]  If patient is discharge on a weekend the patient will need a Match Letter to ast with meds.  NCM gave patient Match Letter for possible dc tomorrow.                  Action/Plan: NCM will follow for dc needs.   Expected Discharge Date:                  Expected Discharge Plan:  Home/Self Care  In-House Referral:     Discharge planning Services  CM Consult, Follow-up appt scheduled, Medication Assistance, [facility]  Post Acute Care Choice:    Choice offered to:     DME Arranged:    DME Agency:     HH Arranged:    HH Agency:     Status of Service:  In process, will continue to follow  If discussed at Long Length of Stay Meetings, dates discussed:    Additional Comments:  Zenon Mayo, RN 06/29/2017, 11:27 AM

## 2017-06-29 NOTE — Progress Notes (Signed)
Pt wallet found to have cash and important cards. Pt OK with security keeping them under lock. Verified contents in wallet with pt. Form signed by RN and pt. Security now in possession. Security form placed in [place] chart.

## 2017-06-29 NOTE — Progress Notes (Signed)
Received patient from [place]  alert and oriented, no distress noted.Patient no complaints of any pain or discomfort.Agree with previous RN's assessment.

## 2017-06-29 NOTE — Progress Notes (Signed)
CSW acknowledged consult. For medication assistance please consult RN CM for assistance. CSW reached out to [employer] Ms. Harris to confirm that pt is on her list to be seen today or early next week. CSW left VM asking that she call and confirm this with CSW.      Donald Myers, MSW, [facility] Clinical Social Worker [phone]

## 2017-06-29 NOTE — Progress Notes (Signed)
[facility]  S: feels better, wants to eat; says he had been feeling a little unsteady over the last few weeks.  Says he hasn't taken synthroid in a few years  O:  Vitals:   06/29/17 0815 06/29/17 0830 06/29/17 0845 06/29/17 0900  BP: 94/65  92/79 96/69  Pulse: (!) 54  (!) 56 (!) 56  Resp: (!) 8 19 10 12   Temp: 97.6 F (36.4 C)     TempSrc: Oral     SpO2: 100%  91% 98%  Weight:      Height:       General:  Resting comfortably in bed HENT: NCAT OP clear PULM: CTA B, normal effort CV: RRR, no mgr GI: BS+, soft, nontender MSK: normal bulk and tone Neuro: awake, alert, no distress, MAEW  CBC    Component Value Date/Time   WBC 7.7 06/29/2017 0400   RBC 3.20 (L) 06/29/2017 0400   HGB 9.9 (L) 06/29/2017 0400   HGB 13.4 08/18/2008 1600   HCT 29.8 (L) 06/29/2017 0400   HCT 39.9 08/18/2008 1600   PLT 126 (L) 06/29/2017 0400   PLT 187 08/18/2008 1600   MCV 93.1 06/29/2017 0400   MCV 83.5 08/18/2008 1600   MCH 30.9 06/29/2017 0400   MCHC 33.2 06/29/2017 0400   RDW 15.2 06/29/2017 0400   RDW 14.7 (H) 08/18/2008 1600   LYMPHSABS 1.1 06/28/2017 0948   LYMPHSABS 1.9 08/18/2008 1600   MONOABS 0.2 06/28/2017 0948   MONOABS 0.9 08/18/2008 1600   EOSABS 0.1 06/28/2017 0948   EOSABS 0.1 08/18/2008 1600   BASOSABS 0.0 06/28/2017 0948   BASOSABS 0.0 08/18/2008 1600   BMET    Component Value Date/Time   NA 132 (L) 06/29/2017 0400   K 3.7 06/29/2017 0400   CL 100 (L) 06/29/2017 0400   CO2 21 (L) 06/29/2017 0400   GLUCOSE 106 (H) 06/29/2017 0400   BUN 15 06/29/2017 0400   CREATININE 1.11 06/29/2017 0400   CALCIUM 8.2 (L) 06/29/2017 0400   GFRNONAA >60 06/29/2017 0400   GFRAA >60 06/29/2017 0400   Impression/Plan:  Subdural hemorrhage: no intervention necessary, no anticoagulation Hypothyroidism: I do not feel he has myxedema coma as he is feeling much better today, however he is clearly hypothyroid, will start back oral synthroid 174mcg daily; monitor mental status,  hemodynamics here Acute encephalopathy: due to trauma, cold, improved Hypothermia: resolved, due to environmental exposure, exaggerated by hypothyroidism ?C3 lesion> MRI spine ordered Regular diet  Move to med tele floor, TRH  Roselie Awkward, MD [facility] Pager: [phone] Cell: [phone] After 3pm or if no response, call [phone]

## 2017-06-30 ENCOUNTER — Encounter (HOSPITAL_COMMUNITY): Payer: Self-pay | Admitting: *Deleted

## 2017-06-30 ENCOUNTER — Other Ambulatory Visit: Payer: Self-pay

## 2017-06-30 DIAGNOSIS — M6282 Rhabdomyolysis: Secondary | ICD-10-CM

## 2017-06-30 LAB — BASIC METABOLIC PANEL
Anion gap: 8 (ref 5–15)
BUN: 15 mg/dL (ref 6–20)
CO2: 26 mmol/L (ref 22–32)
Calcium: 8.7 mg/dL — ABNORMAL LOW (ref 8.9–10.3)
Chloride: 100 mmol/L — ABNORMAL LOW (ref 101–111)
Creatinine, Ser: 1.15 mg/dL (ref 0.61–1.24)
GFR calc Af Amer: >60 mL/min (ref >60)
GLUCOSE: 66 mg/dL (ref 65–99)
POTASSIUM: 3.9 mmol/L (ref 3.5–5.1)
Sodium: 134 mmol/L — ABNORMAL LOW (ref 135–145)

## 2017-06-30 LAB — URINE CULTURE
CULTURE: NO GROWTH
Special Requests: NORMAL

## 2017-06-30 LAB — PROCALCITONIN

## 2017-06-30 LAB — CK: Total CK: 941 U/L — ABNORMAL HIGH (ref 49–397)

## 2017-06-30 MED ORDER — MAGNESIUM SULFATE 2 GM/50ML IV SOLN
2.0000 g | Freq: Once | INTRAVENOUS | Status: AC
  Administered 2017-06-30: 2 g via INTRAVENOUS
  Filled 2017-06-30: qty 50

## 2017-06-30 MED ORDER — SODIUM CHLORIDE 0.9 % IV SOLN
INTRAVENOUS | Status: DC
  Administered 2017-06-30: 22:00:00 via INTRAVENOUS
  Administered 2017-06-30: 100 mL/h via INTRAVENOUS
  Administered 2017-07-01 (×2): via INTRAVENOUS

## 2017-06-30 NOTE — Progress Notes (Addendum)
PROGRESS NOTE    Donald Myers  GMW:[id number] DOB: September 12, 1955 DOA: 06/28/2017 PCP: No primary care provider on file.    Brief Narrative: Donald Myers is brought to [place] after he fell down trying to get on a bus earlier today.  He was found to be bradycardic hypotensive and extremely hypothermic.  He does have a history of hypothyroidism and claims that he takes his medicine but his TSH in the emergency room is 31.  He denies any fevers chills or sweats, he denies any cough, he denies dysuria, he denies abdominal pain.  He denies having been standing at the bus stop for an extended period of time this morning     Assessment & Plan:   Active Problems:   Hypothermia   1-Hypotension, hypothermia; he was treated with IV fluids, IV steroid stress dose, and was on levophed. Hypotension related to hypovolemia.  Hypothermia thought to be related to environmental exposure.  Unlikely infection pro-calcitonin low,  Cortisol 57. Urine culture no growth. Blood culture no growth.   Bilateral subdural Hematoma; stable. Avoid anticoagulation.   Epidural edema C 3-C 6. Bone contusion C - 1- C 2, cervical spondylosis C 5-6, right anterior cord impingement.  MRI reviewed by neurosurgery, recommended out patient follow up if patient develops severe pain.  No focal deficit on exam.   Fall, PT , OT evaluation.   Acute encephalopathy; due to trauma. Improving.   Hypomagnesemia; replete IV.   Hyponatremia, mild increase CK; rhabdo ; continue with IV fluids.   Hypothyroidism; TSH at 65. Restarted on synthroid.     DVT prophylaxis: scd Code Status:  Full code.  Family Communication: care discussed with patient.  Disposition Plan: needs PT.   Consultants:   CCM admitted patient    Procedures: none   Antimicrobials:  none  Subjective: He denies worsening headaches. No focal  weakness.   Objective: Vitals:   06/30/17 0008 06/30/17 0645 06/30/17 0754 06/30/17 1236  BP: 102/61  133/88 113/70 105/70  Pulse: (!) 51 65 (!) 54 (!) 54  Resp: 16 18 18 18   Temp: 97.6 F (36.4 C) 97.8 F (36.6 C) 97.8 F (36.6 C) 97.6 F (36.4 C)  TempSrc: Oral Oral Oral Oral  SpO2: 100% 100% 93% 97%  Weight:  95.3 kg (210 lb)    Height:        Intake/Output Summary (Last 24 hours) at 06/30/2017 1339 Last data filed at 06/30/2017 1237 Gross per 24 hour  Intake 360 ml  Output 1425 ml  Net -1065 ml   Filed Weights   06/29/17 0500 06/29/17 1656 06/30/17 0645  Weight: 97 kg (213 lb 13.5 oz) 96.2 kg (212 lb 2 oz) 95.3 kg (210 lb)    Examination:  General exam: Appears calm and comfortable  Respiratory system: Clear to auscultation. Respiratory effort normal. Cardiovascular system: S1 & S2 heard, RRR. No JVD, murmurs, rubs, gallops or clicks. No pedal edema. Gastrointestinal system: Abdomen is nondistended, soft and nontender. No organomegaly or masses felt. Normal bowel sounds heard. Central nervous system: Alert and oriented. No focal neurological deficits. Extremities: Symmetric 5 x 5 power. Skin: No rashes, lesions or ulcers   Data Reviewed: I have personally reviewed following labs and imaging studies  CBC: Recent Labs  Lab 06/28/17 0948 06/28/17 1859 06/29/17 0400  WBC 5.3 5.6 7.7  NEUTROABS 3.9  --   --   HGB 10.7* 9.7* 9.9*  HCT 32.1* 29.2* 29.8*  MCV 93.0 92.4 93.1  PLT 103* 108* 126*  Basic Metabolic Panel: Recent Labs  Lab 06/28/17 0948 06/28/17 1859 06/29/17 0400 06/30/17 0841  NA 134*  --  132* 134*  K 3.7  --  3.7 3.9  CL 100*  --  100* 100*  CO2 24  --  21* 26  GLUCOSE 107*  --  106* 66  BUN 19  --  15 15  CREATININE 0.99 0.99 1.11 1.15  CALCIUM 8.7*  --  8.2* 8.7*  MG  --   --  1.6*  --   PHOS  --   --  3.3  --    GFR: Estimated Creatinine Clearance: 76.2 mL/min (by C-G formula based on SCr of 1.15 mg/dL). Liver Function Tests: Recent Labs  Lab 06/28/17 0948 06/29/17 0400  AST 41 31  ALT 36 31  ALKPHOS 51 48  BILITOT 1.4* 1.4*   PROT 6.6 6.5  ALBUMIN 3.7 3.3*   No results for input(s): LIPASE, AMYLASE in the last 168 hours. No results for input(s): AMMONIA in the last 168 hours. Coagulation Profile: Recent Labs  Lab 06/28/17 0948  INR 0.97   Cardiac Enzymes: Recent Labs  Lab 06/28/17 0948 06/29/17 0400 06/30/17 0841  CKTOTAL 1,495* 990* 941*   BNP (last 3 results) No results for input(s): PROBNP in the last 8760 hours. HbA1C: No results for input(s): HGBA1C in the last 72 hours. CBG: Recent Labs  Lab 06/28/17 1235 06/28/17 2014  GLUCAP 73 88   Lipid Profile: No results for input(s): CHOL, HDL, LDLCALC, TRIG, CHOLHDL, LDLDIRECT in the last 72 hours. Thyroid Function Tests: Recent Labs    06/28/17 0948  TSH 31.180*  FREET4 <0.25*   Anemia Panel: No results for input(s): VITAMINB12, FOLATE, FERRITIN, TIBC, IRON, RETICCTPCT in the last 72 hours. Sepsis Labs: Recent Labs  Lab 06/28/17 1010 06/28/17 1201 06/28/17 1859 06/29/17 0400 06/30/17 0332  PROCALCITON  --   --  <0.10 <0.10 <0.10  LATICACIDVEN 1.03 0.61  --   --   --     Recent Results (from the past 240 hour(s))  Blood Culture (routine x 2)     Status: None (Preliminary result)   Collection Time: 06/28/17  9:55 AM  Result Value Ref Range Status   Specimen Description BLOOD LEFT ANTECUBITAL  Final   Special Requests   Final    BOTTLES DRAWN AEROBIC AND ANAEROBIC Blood Culture adequate volume   Culture NO GROWTH 2 DAYS  Final   Report Status PENDING  Incomplete  Blood Culture (routine x 2)     Status: None (Preliminary result)   Collection Time: 06/28/17 10:02 AM  Result Value Ref Range Status   Specimen Description BLOOD RIGHT ANTECUBITAL  Final   Special Requests   Final    BOTTLES DRAWN AEROBIC AND ANAEROBIC Blood Culture adequate volume   Culture NO GROWTH 2 DAYS  Final   Report Status PENDING  Incomplete  MRSA PCR Screening     Status: None   Collection Time: 06/28/17  6:11 PM  Result Value Ref Range Status    MRSA by PCR NEGATIVE NEGATIVE Final    Comment:        The GeneXpert MRSA Assay (FDA approved for NASAL specimens only), is one component of a comprehensive MRSA colonization surveillance program. It is not intended to diagnose MRSA infection nor to guide or monitor treatment for MRSA infections.   Urine culture     Status: None   Collection Time: 06/29/17 12:15 AM  Result Value Ref Range Status  Specimen Description URINE, CLEAN CATCH  Final   Special Requests Normal  Final   Culture NO GROWTH  Final   Report Status 06/30/2017 FINAL  Final         Radiology Studies: Ct Head Wo Contrast  Result Date: 06/28/2017 CLINICAL DATA:  Pain following fall EXAM: CT HEAD WITHOUT CONTRAST CT CERVICAL SPINE WITHOUT CONTRAST TECHNIQUE: Multidetector CT imaging of the head and cervical spine was performed following the standard protocol without intravenous contrast. Multiplanar CT image reconstructions of the cervical spine were also generated. COMPARISON:  Head CT August 27, 2008 FINDINGS: CT HEAD FINDINGS Brain: The ventricles are normal in size and configuration. There is a subdural hematoma over the right frontal and superior temporal lobes with a maximum thickness of 3 mm. There is a subdural hematoma on the left over the superior temporal region with a maximum thickness of just over 2 mm. There is no intra-axial fluid. No midline shift. No mass evident. There is slight small vessel disease in the posterior centra semiovale bilaterally. Elsewhere gray-white compartments appear normal. No acute infarct evident. Vascular: No hyperdense vessel. There is calcification in each carotid siphon. Skull: Bony calvarium appears intact. Sinuses/Orbits: There is opacification in multiple ethmoid air cells. There is nares edema with obstruction of the nasal cavities bilaterally. Visualized orbits appear symmetric bilaterally. Other: Mastoid air cells are clear. There is debris in each external auditory canal.  CT CERVICAL SPINE FINDINGS Alignment: There is 3 mm of anterolisthesis of C3 on C4. There is 2 mm of retrolisthesis of C5 on C6. There is 2 mm of retrolisthesis of C6 on C7. Skull base and vertebrae: There is advanced erosive change involving the superior aspect of the odontoid with pannus in this area. Skull base and craniocervical junction regions appear normal. No acute fracture is evident. There is advanced erosive change in the posterior elements on the left at C3. There is widening of the foramen transversariae on the left at C3 and C4. There are no blastic lesions. Soft tissues and spinal canal: No soft tissue masses are evident. No paraspinous lesions are appreciable. No cord or canal hematoma is appreciable. Disc levels: There is marked disc space narrowing at C5-6 and C6-7. There is multilevel facet osteoarthritic change. No frank disc extrusion or high-grade stenosis. 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 Other: There is atherosclerotic calcification in both carotid arteries. IMPRESSION: CT head: 1. Small subdural hematomas on each side. The right subdural hematoma seen at the superior right temporal and posterior right frontal region measures 3 mm in maximum thickness. The subdural hematoma on the left at the superiorly left temporal region measures just over 2 mm. There is no appreciable mass effect. No midline shift. 2.  No intra-axial hemorrhage. 3. Mild periventricular small vessel disease. No evident acute infarct. 4.  There are foci of arterial vascular calcification. 5. Areas of paranasal sinus disease noted. There is edema causing obstruction of each nasal cavity. 6.  Probable cerumen in each external auditory canal. CT cervical spine: 1.  No acute fracture. 2. Areas of mild spondylolisthesis are felt to be due to underlying spondylosis. 3. 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 Suspect underlying erosive  arthropathy. Note that the predental space region is not widened. Underlying neoplasm potentially could present in this manner. Given this overall appearance, it may be prudent to correlate the cervical MRI pre and post-contrast to further evaluate.  Widening of the foramen transversarium at C3 and C4 on the left is of uncertain etiology. Donald correlation with respect to these changes may also be helpful. 4.  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 5.  There is calcification in each carotid artery. Critical Value/emergent results were called by telephone at the time of interpretation on 06/28/2017 at 5:03 pm to Dr. Theotis Burrow , who verbally acknowledged these results. Electronically Signed   By: Lowella Grip III M.D.   On: 06/28/2017 17:03   Ct Cervical Spine Wo Contrast  Result Date: 06/28/2017 CLINICAL DATA:  Pain following fall EXAM: CT HEAD WITHOUT CONTRAST CT CERVICAL SPINE WITHOUT CONTRAST TECHNIQUE: Multidetector CT imaging of the head and cervical spine was performed following the standard protocol without intravenous contrast. Multiplanar CT image reconstructions of the cervical spine were also generated. COMPARISON:  Head CT August 27, 2008 FINDINGS: CT HEAD FINDINGS Brain: The ventricles are normal in size and configuration. There is a subdural hematoma over the right frontal and superior temporal lobes with a maximum thickness of 3 mm. There is a subdural hematoma on the left over the superior temporal region with a maximum thickness of just over 2 mm. There is no intra-axial fluid. No midline shift. No mass evident. There is slight small vessel disease in the posterior centra semiovale bilaterally. Elsewhere gray-white compartments appear normal. No acute infarct evident. Vascular: No hyperdense vessel. There is calcification in each carotid siphon. Skull: Bony calvarium appears intact. Sinuses/Orbits: There is opacification in multiple ethmoid air cells. There is nares edema with  obstruction of the nasal cavities bilaterally. Visualized orbits appear symmetric bilaterally. Other: Mastoid air cells are clear. There is debris in each external auditory canal. CT CERVICAL SPINE FINDINGS Alignment: There is 3 mm of anterolisthesis of C3 on C4. There is 2 mm of retrolisthesis of C5 on C6. There is 2 mm of retrolisthesis of C6 on C7. Skull base and vertebrae: There is advanced erosive change involving the superior aspect of the odontoid with pannus in this area. Skull base and craniocervical junction regions appear normal. No acute fracture is evident. There is advanced erosive change in the posterior elements on the left at C3. There is widening of the foramen transversariae on the left at C3 and C4. There are no blastic lesions. Soft tissues and spinal canal: No soft tissue masses are evident. No paraspinous lesions are appreciable. No cord or canal hematoma is appreciable. Disc levels: There is marked disc space narrowing at C5-6 and C6-7. There is multilevel facet osteoarthritic change. No frank disc extrusion or high-grade stenosis. 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 Other: There is atherosclerotic calcification in both carotid arteries. IMPRESSION: CT head: 1. Small subdural hematomas on each side. The right subdural hematoma seen at the superior right temporal and posterior right frontal region measures 3 mm in maximum thickness. The subdural hematoma on the left at the superiorly left temporal region measures just over 2 mm. There is no appreciable mass effect. No midline shift. 2.  No intra-axial hemorrhage. 3. Mild periventricular small vessel disease. No evident acute infarct. 4.  There are foci of arterial vascular calcification. 5. Areas of paranasal sinus disease noted. There is edema causing obstruction of each nasal cavity. 6.  Probable cerumen in each external auditory canal. CT cervical spine: 1.  No acute fracture. 2. Areas of mild spondylolisthesis are felt to  be due to underlying spondylosis. 3. 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 Suspect underlying erosive arthropathy. Note that the predental space region is not widened. Underlying neoplasm potentially could present in this manner. Given this overall appearance, it may be prudent to correlate the cervical MRI pre and post-contrast to further evaluate. Widening of the foramen transversarium at C3 and C4 on the left is of uncertain etiology. Donald correlation with respect to these changes may also be helpful. 4.  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 5.  There is calcification in each carotid artery. Critical Value/emergent results were called by telephone at the time of interpretation on 06/28/2017 at 5:03 pm to Dr. Theotis Burrow , who verbally acknowledged these results. Electronically Signed   By: Lowella Grip III M.D.   On: 06/28/2017 17:03   Donald Cervical Spine W Wo Contrast  Result Date: 06/29/2017 CLINICAL DATA:  61 y/o M; neck pain with abnormal cervical spine CT. EXAM: MRI CERVICAL SPINE WITHOUT AND WITH CONTRAST TECHNIQUE: Multiplanar and multiecho pulse sequences of the cervical spine, to include the craniocervical junction and cervicothoracic junction, were obtained without and with intravenous contrast. CONTRAST:  7mL MULTIHANCE GADOBENATE DIMEGLUMINE 529 MG/ML IV SOLN COMPARISON:  06/28/2017 CT cervical spine FINDINGS: Motion degradation on multiple sequences. Alignment: C3-4 grade 1 anterolisthesis and reversal of cervical curvature. Vertebrae: Mild edema is present within odontoid process and lateral masses of C1. No significant C1-2 surrounding soft tissue pannus. Erosive changes of the odontoid process with sclerosis are better characterized on the CT of the cervical spine. Mild opposing endplate edema is present at the C5-6 level without intervening abnormal disc signal. No loss of vertebral body height. No  significant edema or enhancement of the facet joints. No evidence for neoplasm or discitis. Cord: No abnormal cord signal or enhancement identified. There is fluid in the epidural space posteriorly from C3-C6 without abnormal enhancement. Posterior Fossa, vertebral arteries, paraspinal tissues: There is a small volume of prevertebral fluid at the C3-C6 levels without enhancement. Disc levels: C2-3: No significant disc displacement, foraminal stenosis, or canal stenosis. Bilateral facet arthropathy. C3-4: Disc osteophyte complex with uncovertebral and facet hypertrophy severe on the left. Moderate right and severe left foraminal stenosis. Mild canal stenosis. C4-5: Disc osteophyte complex with uncovertebral and facet hypertrophy, severe on the left. Mild right and moderate left foraminal stenosis. Mild canal stenosis. C5-6: Large disc osteophyte complex eccentric to the right with extensive bilateral uncovertebral and facet hypertrophy. Severe bilateral foraminal stenosis. Moderate canal stenosis with right anterior cord impingement and flattening. C6-7: Disc osteophyte complex with left-greater-than-right uncovertebral and facet hypertrophy. Moderate to severe bilateral foraminal stenosis. Mild canal stenosis. C7-T1: No significant disc displacement, foraminal stenosis, or canal stenosis. IMPRESSION: 1. Small volume of prevertebral and epidural edema is present from the C3 through C6 levels. Additionally, there is edema within the C5 and C6 vertebral bodies without loss of vertebral body height. In the setting of trauma this probably represents underlying ligamentous injury and bone contusion. 2. No abnormal cord signal or enhancement. 3. Mild edema within odontoid process and lateral masses of C1 vertebral body may represent bone contusion in the setting of trauma or be related to degenerative changes of the anterior C1-2 articulation. 4. No significant soft tissue pannus associated with odontoid process to suggest  inflammatory arthropathy such as rheumatoid arthritis, gout, or CPPD. Similarly there is no abnormal enhancement of the facet joints. 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 5. Advanced cervical spondylosis greatest at the C5-6 level with there is  moderate canal stenosis and right anterior cord impingement. Electronically Signed   By: Kristine Garbe M.D.   On: 06/29/2017 22:53        Scheduled Meds: . bacitracin  1 application Topical BID  . levothyroxine  100 mcg Oral QAC breakfast   Continuous Infusions: . sodium chloride    . sodium chloride 100 mL/hr (06/30/17 0911)  . magnesium sulfate 1 - 4 g bolus IVPB       LOS: 2 days    Time spent: 35 minutes.     Elmarie Shiley, MD [facility] Pager [phone]  If 7PM-7AM, please contact night-coverage [email or web address] Password [email or web address] 06/30/2017, 1:39 PM

## 2017-06-30 NOTE — Plan of Care (Signed)
  Education: Knowledge of General Education information will improve 06/30/2017 0407 - Completed/Met by Evert Kohl, RN   Clinical Measurements: Ability to maintain clinical measurements within normal limits will improve 06/30/2017 0407 - Completed/Met by Garnett-Mellinger, Sophronia Simas, RN

## 2017-07-01 ENCOUNTER — Encounter (HOSPITAL_COMMUNITY): Payer: Self-pay | Admitting: *Deleted

## 2017-07-01 LAB — CBC
HEMATOCRIT: 28.6 % — AB (ref 39.0–52.0)
Hemoglobin: 9.3 g/dL — ABNORMAL LOW (ref 13.0–17.0)
MCH: 31 pg (ref 26.0–34.0)
MCHC: 32.5 g/dL (ref 30.0–36.0)
MCV: 95.3 fL (ref 78.0–100.0)
PLATELETS: 96 10*3/uL — AB (ref 150–400)
RBC: 3 MIL/uL — ABNORMAL LOW (ref 4.22–5.81)
RDW: 16.2 % — AB (ref 11.5–15.5)
WBC: 4.8 10*3/uL (ref 4.0–10.5)

## 2017-07-01 LAB — BASIC METABOLIC PANEL
ANION GAP: 6 (ref 5–15)
BUN: 15 mg/dL (ref 6–20)
CALCIUM: 8.6 mg/dL — AB (ref 8.9–10.3)
CO2: 28 mmol/L (ref 22–32)
Chloride: 103 mmol/L (ref 101–111)
Creatinine, Ser: 1.24 mg/dL (ref 0.61–1.24)
Glucose, Bld: 72 mg/dL (ref 65–99)
Potassium: 4.1 mmol/L (ref 3.5–5.1)
Sodium: 137 mmol/L (ref 135–145)

## 2017-07-01 LAB — CK: CK TOTAL: 700 U/L — AB (ref 49–397)

## 2017-07-01 LAB — VITAMIN B12: VITAMIN B 12: 247 pg/mL (ref 180–914)

## 2017-07-01 MED ORDER — ENSURE ENLIVE PO LIQD
237.0000 mL | Freq: Three times a day (TID) | ORAL | Status: DC
Start: 2017-07-01 — End: 2017-07-02
  Administered 2017-07-01 – 2017-07-02 (×4): 237 mL via ORAL

## 2017-07-01 NOTE — Progress Notes (Signed)
PROGRESS NOTE    Donald Myers  IOX:[id number] DOB: 1956/01/16 DOA: 06/28/2017 PCP: No primary care provider on file.    Brief Narrative: Donald Myers is brought to [place] after he fell down trying to get on a bus earlier today.  He was found to be bradycardic hypotensive and extremely hypothermic.  He does have a history of hypothyroidism and claims that he takes his medicine but his TSH in the emergency room is 31.  He denies any fevers chills or sweats, he denies any cough, he denies dysuria, he denies abdominal pain. He denies having been standing at the bus stop for an extended period of time this morning.   Assessment & Plan:   Active Problems:   Hypothermia   1-Hypotension, Hypothermia; he was treated with IV fluids, IV steroid stress dose, and was on levophed. Hypotension related to hypovolemia.  Hypothermia thought to be related to environmental exposure.  Unlikely infection pro-calcitonin low,  Cortisol 57. Urine culture no growth. Blood culture no growth.  Resolved.   Bilateral subdural Hematoma; stable. Avoid anticoagulation.  Stable.   Epidural edema C 3-C 6. Bone contusion C - 1- C 2, cervical spondylosis C 5-6, right anterior cord impingement.  MRI reviewed by neurosurgery, recommended out patient follow up if patient develops severe pain.  No focal deficit on exam.   Fall, PT , OT evaluation.   Acute encephalopathy; due to trauma. Improving.  Check B 12.   Hypomagnesemia; replete IV. Check labs in am.   Hyponatremia, mild increase CK; rhabdo ; continue with IV fluids.  Ck trending down.   Hypothyroidism; TSH at 25. Restarted on synthroid.     DVT prophylaxis: scd Code Status:  Full code.  Family Communication: care discussed with patient.  Disposition Plan: await PT evaluation, OT, might need SNF.    Consultants:   CCM admitted patient    Procedures: none   Antimicrobials:  none  Subjective: He is slowly answering questions. Denies  worsening pain. Only has neck pain early in the morning.   Objective: Vitals:   06/30/17 1733 06/30/17 2042 07/01/17 0017 07/01/17 0522  BP: 99/66 (!) 93/54 100/62 118/74  Pulse: (!) 58 (!) 59 (!) 59 (!) 55  Resp: 18 18 18 18   Temp: 97.8 F (36.6 C) (!) 97.5 F (36.4 C) 97.8 F (36.6 C) 97.9 F (36.6 C)  TempSrc: Oral Oral Oral Oral  SpO2: 97% 95% 94% 91%  Weight:    96.1 kg (211 lb 12.8 oz)  Height:        Intake/Output Summary (Last 24 hours) at 07/01/2017 1137 Last data filed at 07/01/2017 0929 Gross per 24 hour  Intake 2070 ml  Output 1625 ml  Net 445 ml   Filed Weights   06/29/17 1656 06/30/17 0645 07/01/17 0522  Weight: 96.2 kg (212 lb 2 oz) 95.3 kg (210 lb) 96.1 kg (211 lb 12.8 oz)    Examination:  General exam: NAD Respiratory system: Normal respiratory effort. CTA Cardiovascular system: S 1, S 2 RRR Gastrointestinal system: BS present, soft, nt Central nervous system: non focal.  Extremities: Symmetric power.  Skin: No rashes, lesions or ulcers   Data Reviewed: I have personally reviewed following labs and imaging studies  CBC: Recent Labs  Lab 06/28/17 0948 06/28/17 1859 06/29/17 0400 07/01/17 0456  WBC 5.3 5.6 7.7 4.8  NEUTROABS 3.9  --   --   --   HGB 10.7* 9.7* 9.9* 9.3*  HCT 32.1* 29.2* 29.8* 28.6*  MCV 93.0  92.4 93.1 95.3  PLT 103* 108* 126* 96*   Basic Metabolic Panel: Recent Labs  Lab 06/28/17 0948 06/28/17 1859 06/29/17 0400 06/30/17 0841 07/01/17 0456  NA 134*  --  132* 134* 137  K 3.7  --  3.7 3.9 4.1  CL 100*  --  100* 100* 103  CO2 24  --  21* 26 28  GLUCOSE 107*  --  106* 66 72  BUN 19  --  15 15 15   CREATININE 0.99 0.99 1.11 1.15 1.24  CALCIUM 8.7*  --  8.2* 8.7* 8.6*  MG  --   --  1.6*  --   --   PHOS  --   --  3.3  --   --    GFR: Estimated Creatinine Clearance: 76.5 mL/min (by C-G formula based on SCr of 1.24 mg/dL). Liver Function Tests: Recent Labs  Lab 06/28/17 0948 06/29/17 0400  AST 41 31  ALT 36 31    ALKPHOS 51 48  BILITOT 1.4* 1.4*  PROT 6.6 6.5  ALBUMIN 3.7 3.3*   No results for input(s): LIPASE, AMYLASE in the last 168 hours. No results for input(s): AMMONIA in the last 168 hours. Coagulation Profile: Recent Labs  Lab 06/28/17 0948  INR 0.97   Cardiac Enzymes: Recent Labs  Lab 06/28/17 0948 06/29/17 0400 06/30/17 0841 07/01/17 0456  CKTOTAL 1,495* 990* 941* 700*   BNP (last 3 results) No results for input(s): PROBNP in the last 8760 hours. HbA1C: No results for input(s): HGBA1C in the last 72 hours. CBG: Recent Labs  Lab 06/28/17 1235 06/28/17 2014  GLUCAP 73 88   Lipid Profile: No results for input(s): CHOL, HDL, LDLCALC, TRIG, CHOLHDL, LDLDIRECT in the last 72 hours. Thyroid Function Tests: No results for input(s): TSH, T4TOTAL, FREET4, T3FREE, THYROIDAB in the last 72 hours. Anemia Panel: No results for input(s): VITAMINB12, FOLATE, FERRITIN, TIBC, IRON, RETICCTPCT in the last 72 hours. Sepsis Labs: Recent Labs  Lab 06/28/17 1010 06/28/17 1201 06/28/17 1859 06/29/17 0400 06/30/17 0332  PROCALCITON  --   --  <0.10 <0.10 <0.10  LATICACIDVEN 1.03 0.61  --   --   --     Recent Results (from the past 240 hour(s))  Blood Culture (routine x 2)     Status: None (Preliminary result)   Collection Time: 06/28/17  9:55 AM  Result Value Ref Range Status   Specimen Description BLOOD LEFT ANTECUBITAL  Final   Special Requests   Final    BOTTLES DRAWN AEROBIC AND ANAEROBIC Blood Culture adequate volume   Culture NO GROWTH 2 DAYS  Final   Report Status PENDING  Incomplete  Blood Culture (routine x 2)     Status: None (Preliminary result)   Collection Time: 06/28/17 10:02 AM  Result Value Ref Range Status   Specimen Description BLOOD RIGHT ANTECUBITAL  Final   Special Requests   Final    BOTTLES DRAWN AEROBIC AND ANAEROBIC Blood Culture adequate volume   Culture NO GROWTH 2 DAYS  Final   Report Status PENDING  Incomplete  MRSA PCR Screening     Status:  None   Collection Time: 06/28/17  6:11 PM  Result Value Ref Range Status   MRSA by PCR NEGATIVE NEGATIVE Final    Comment:        The GeneXpert MRSA Assay (FDA approved for NASAL specimens only), is one component of a comprehensive MRSA colonization surveillance program. It is not intended to diagnose MRSA infection nor to guide or  monitor treatment for MRSA infections.   Urine culture     Status: None   Collection Time: 06/29/17 12:15 AM  Result Value Ref Range Status   Specimen Description URINE, CLEAN CATCH  Final   Special Requests Normal  Final   Culture NO GROWTH  Final   Report Status 06/30/2017 FINAL  Final         Radiology Studies: Donald Cervical Spine W Wo Contrast  Result Date: 06/29/2017 CLINICAL DATA:  61 y/o M; neck pain with abnormal cervical spine CT. EXAM: MRI CERVICAL SPINE WITHOUT AND WITH CONTRAST TECHNIQUE: Multiplanar and multiecho pulse sequences of the cervical spine, to include the craniocervical junction and cervicothoracic junction, were obtained without and with intravenous contrast. CONTRAST:  56mL MULTIHANCE GADOBENATE DIMEGLUMINE 529 MG/ML IV SOLN COMPARISON:  06/28/2017 CT cervical spine FINDINGS: Motion degradation on multiple sequences. Alignment: C3-4 grade 1 anterolisthesis and reversal of cervical curvature. Vertebrae: Mild edema is present within odontoid process and lateral masses of C1. No significant C1-2 surrounding soft tissue pannus. Erosive changes of the odontoid process with sclerosis are better characterized on the CT of the cervical spine. Mild opposing endplate edema is present at the C5-6 level without intervening abnormal disc signal. No loss of vertebral body height. No significant edema or enhancement of the facet joints. No evidence for neoplasm or discitis. Cord: No abnormal cord signal or enhancement identified. There is fluid in the epidural space posteriorly from C3-C6 without abnormal enhancement. Posterior Fossa, vertebral  arteries, paraspinal tissues: There is a small volume of prevertebral fluid at the C3-C6 levels without enhancement. Disc levels: C2-3: No significant disc displacement, foraminal stenosis, or canal stenosis. Bilateral facet arthropathy. C3-4: Disc osteophyte complex with uncovertebral and facet hypertrophy severe on the left. Moderate right and severe left foraminal stenosis. Mild canal stenosis. C4-5: Disc osteophyte complex with uncovertebral and facet hypertrophy, severe on the left. Mild right and moderate left foraminal stenosis. Mild canal stenosis. C5-6: Large disc osteophyte complex eccentric to the right with extensive bilateral uncovertebral and facet hypertrophy. Severe bilateral foraminal stenosis. Moderate canal stenosis with right anterior cord impingement and flattening. C6-7: Disc osteophyte complex with left-greater-than-right uncovertebral and facet hypertrophy. Moderate to severe bilateral foraminal stenosis. Mild canal stenosis. C7-T1: No significant disc displacement, foraminal stenosis, or canal stenosis. IMPRESSION: 1. Small volume of prevertebral and epidural edema is present from the C3 through C6 levels. Additionally, there is edema within the C5 and C6 vertebral bodies without loss of vertebral body height. In the setting of trauma this probably represents underlying ligamentous injury and bone contusion. 2. No abnormal cord signal or enhancement. 3. Mild edema within odontoid process and lateral masses of C1 vertebral body may represent bone contusion in the setting of trauma or be related to degenerative changes of the anterior C1-2 articulation. 4. No significant soft tissue pannus associated with odontoid process to suggest inflammatory arthropathy such as rheumatoid arthritis, gout, or CPPD. Similarly there is no abnormal enhancement of the facet joints. 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 5. Advanced cervical spondylosis greatest at the C5-6 level with  there is moderate canal stenosis and right anterior cord impingement. Electronically Signed   By: Kristine Garbe M.D.   On: 06/29/2017 22:53        Scheduled Meds: . bacitracin  1 application Topical BID  . feeding supplement (ENSURE ENLIVE)  237 mL Oral TID BM  . levothyroxine  100 mcg Oral QAC breakfast   Continuous Infusions: . sodium chloride    .  sodium chloride 75 mL/hr at 07/01/17 0921     LOS: 3 days    Time spent: 35 minutes.     Elmarie Shiley, MD [facility] Pager [phone]  If 7PM-7AM, please contact night-coverage [email or web address] Password [email or web address] 07/01/2017, 11:37 AM

## 2017-07-01 NOTE — Plan of Care (Signed)
  Clinical Measurements: Will remain free from infection 07/01/2017 0411 - Completed/Met by Evert Kohl, RN

## 2017-07-01 NOTE — Evaluation (Signed)
Physical Therapy Evaluation Patient Details Name: Donald Myers MRN: [id number] DOB: [birth year] Today's Date: 07/01/2017   History of Present Illness  Pt s/p fall trying to get on a bus.  He was found to be bradycardic hypotensive and extremely hypothermic. Bilateral subdural Hematoma, Acute encephalopathy; due to trauma, improving. Epidural edema C 3-C 6. Bone contusion C - 1- C 2, cervical spondylosis C 5-6, right anterior cord impingement.   Clinical Impression  Patient presents with problems listed below.  Will benefit from acute PT to maximize functional mobility prior to discharge.  Patient was independent with mobility and gait pta.  Today patient required min assist to min guard.  Recommend HHPT to continue therapy at discharge.    Follow Up Recommendations Home health PT;Supervision - Intermittent    Equipment Recommendations  (TBD)    Recommendations for Other Services       Precautions / Restrictions Precautions Precautions: Fall Precaution Comments: h/o falls. high fall risk.  Patient reports most of falls are result of tripping over obstacles. Restrictions Weight Bearing Restrictions: No      Mobility  Bed Mobility               General bed mobility comments: Patient in chair as PT entered room  Transfers Overall transfer level: Needs assistance Equipment used: None Transfers: Sit to/from Stand Sit to Stand: Min guard         General transfer comment: Min guard for safety.  Ambulation/Gait Ambulation/Gait assistance: Min assist Ambulation Distance (Feet): 15 Feet(20 steps in place on each foot) Assistive device: None       General Gait Details: Patient fearful of Rt knee buckling.  Remained near chair today taking steps in place.  Stairs            Wheelchair Mobility    Modified Rankin (Stroke Patients Only)       Balance Overall balance assessment: Needs assistance;History of Falls         Standing balance support: No  upper extremity supported Standing balance-Leahy Scale: Poor Standing balance comment: Fair static, poor dynamic.  Min assist during gait for balance/safety.                             Pertinent Vitals/Pain Pain Assessment: No/denies pain    Home Living Family/patient expects to be discharged to:: Private residence Living Arrangements: Alone Available Help at Discharge: Neighbor;Available PRN/intermittently Type of Home: House Home Access: Stairs to enter Entrance Stairs-Rails: None Entrance Stairs-Number of Steps: 2 Home Layout: One level Home Equipment: None      Prior Function Level of Independence: Independent         Comments: Uses bus or friend for transportation.     Hand Dominance        Extremity/Trunk Assessment   Upper Extremity Assessment Upper Extremity Assessment: Defer to OT evaluation    Lower Extremity Assessment Lower Extremity Assessment: Generalized weakness(bilateral knee issues)    Cervical / Trunk Assessment Cervical / Trunk Assessment: Normal  Communication   Communication: No difficulties  Cognition Arousal/Alertness: Awake/alert Behavior During Therapy: Flat affect Overall Cognitive Status: No family/caregiver present to determine baseline cognitive functioning                                 General Comments: Decreased safety awareness and problem solving.       General Comments General  comments (skin integrity, edema, etc.): Bruising on knees    Exercises     Assessment/Plan    PT Assessment Patient needs continued PT services  PT Problem List Decreased strength;Decreased activity tolerance;Decreased balance;Decreased mobility;Decreased coordination;Decreased knowledge of use of DME;Decreased safety awareness       PT Treatment Interventions DME instruction;Gait training;Functional mobility training;Therapeutic activities;Therapeutic exercise;Balance training;Patient/family education    PT  Goals (Current goals can be found in the Care Plan section)  Acute Rehab PT Goals Patient Stated Goal: To return home PT Goal Formulation: With patient Time For Goal Achievement: 07/08/17 Potential to Achieve Goals: Good    Frequency Min 3X/week   Barriers to discharge Decreased caregiver support Lives alone with prn assist.    Co-evaluation               AM-PAC PT "6 Clicks" Daily Activity  Outcome Measure Difficulty turning over in bed (including adjusting bedclothes, sheets and blankets)?: None Difficulty moving from lying on back to sitting on the side of the bed? : None Difficulty sitting down on and standing up from a chair with arms (e.g., wheelchair, bedside commode, etc,.)?: Unable Help needed moving to and from a bed to chair (including a wheelchair)?: A Little Help needed walking in hospital room?: A Little Help needed climbing 3-5 steps with a railing? : A Lot 6 Click Score: 17    End of Session Equipment Utilized During Treatment: Gait belt Activity Tolerance: Patient tolerated treatment well;Patient limited by fatigue Patient left: in chair;with call bell/phone within reach;with chair alarm set Nurse Communication: Mobility status PT Visit Diagnosis: Unsteadiness on feet (R26.81);History of falling (Z91.81);Muscle weakness (generalized) (M62.81)    Time: 2725-3664 PT Time Calculation (min) (ACUTE ONLY): 13 min   Charges:   PT Evaluation $PT Eval Low Complexity: 1 Low     PT G Codes:        Carita Pian. Sanjuana Kava, [facility] Acute Rehab Services Pager [phone]    Despina Pole 07/01/2017, 10:06 PM

## 2017-07-01 NOTE — Evaluation (Signed)
Occupational Therapy Evaluation Patient Details Name: Donald Myers MRN: [id number] DOB: 1956/04/29 Today's Date: 07/01/2017    History of Present Illness Pt s/p fall trying to get on a bus.  He was found to be bradycardic hypotensive and extremely hypothermic. Bilateral subdural Hematoma, Acute encephalopathy; due to trauma, improving. Epidural edema C 3-C 6. Bone contusion C - 1- C 2, cervical spondylosis C 5-6, right anterior cord impingement.    Clinical Impression   Pt admitted with the above diagnoses and presents with below problem list. Pt will benefit from continued acute OT to address the below listed deficits and maximize independence with basic ADLs prior to d/c home. PTA pt reports he was independent with ADLs and either walks or uses bus system for transportation. Pt reports h/o falls with a common theme appearing to be environmental challenge to balance (uneven sidewalk, "one time I tripped on my on shoelaces"). Pt mostly close min guard to min A for in-room functional mobility but did have 1 LOB needing mod A to prevent a fall ambulating to bathroom. High fall risk. Pt presents with decreased safety awareness and problem solving. No family available to provide comparison to baseline cognition. Flat affect. Will follow.      Follow Up Recommendations  Home health OT;Supervision/Assistance - 24 hour    Equipment Recommendations  3 in 1 bedside commode    Recommendations for Other Services PT consult     Precautions / Restrictions Precautions Precautions: Fall Precaution Comments: h/o falls. high fall risk Restrictions Weight Bearing Restrictions: No      Mobility Bed Mobility Overal bed mobility: Modified Independent                Transfers Overall transfer level: Needs assistance   Transfers: Sit to/from Stand Sit to Stand: Min guard;Min assist         General transfer comment: mostly min guard, occasional min A to steady    Balance Overall  balance assessment: History of Falls;Needs assistance Sitting-balance support: No upper extremity supported;Feet supported Sitting balance-Leahy Scale: Good     Standing balance support: No upper extremity supported;During functional activity Standing balance-Leahy Scale: Poor Standing balance comment: fair static, poor dynamic                           ADL either performed or assessed with clinical judgement   ADL Overall ADL's : Needs assistance/impaired Eating/Feeding: Set up;Sitting   Grooming: Set up;Min guard;Sitting;Standing   Upper Body Bathing: Set up;Sitting   Lower Body Bathing: Minimal assistance;Sit to/from stand   Upper Body Dressing : Set up;Sitting   Lower Body Dressing: Minimal assistance;Sit to/from stand   Toilet Transfer: Minimal assistance;Ambulation;Grab bars Toilet Transfer Details (indicate cue type and reason): assist to steady balance. mod A for LOB walking to bathroom Toileting- Clothing Manipulation and Hygiene: Min guard   Tub/ Shower Transfer: Minimal assistance;Ambulation;Grab bars   Functional mobility during ADLs: Moderate assistance General ADL Comments: Pt ambulated to bathroom mostly min guard-min A with 1 LOB needing mod A to prevent a fall. Pt also completed toilet transfer.     Vision         Perception     Praxis      Pertinent Vitals/Pain Pain Assessment: No/denies pain     Hand Dominance     Extremity/Trunk Assessment Upper Extremity Assessment Upper Extremity Assessment: Overall WFL for tasks assessed   Lower Extremity Assessment Lower Extremity Assessment: Defer to PT  evaluation(reports B knee problems at baseline)   Cervical / Trunk Assessment Cervical / Trunk Assessment: Normal   Communication Communication Communication: No difficulties   Cognition Arousal/Alertness: Awake/alert Behavior During Therapy: Flat affect Overall Cognitive Status: No family/caregiver present to determine baseline  cognitive functioning                                 General Comments: Decreased safety awareness and problem solving.    General Comments       Exercises     Shoulder Instructions      Home Living Family/patient expects to be discharged to:: Private residence Living Arrangements: Alone Available Help at Discharge: Available PRN/intermittently(1 neighbor) Type of Home: House Home Access: Stairs to enter [employer] of Steps: 2   Home Layout: One level     Bathroom Shower/Tub: Tub/shower unit         Home Equipment: None          Prior Functioning/Environment Level of Independence: Independent        Comments: reliant on external support during ambulation, h/o falls.         OT Problem List: Decreased activity tolerance;Impaired balance (sitting and/or standing);Decreased cognition;Decreased safety awareness;Decreased knowledge of use of DME or AE;Decreased knowledge of precautions;Cardiopulmonary status limiting activity      OT Treatment/Interventions: Self-care/ADL training;DME and/or AE instruction;Energy conservation;Therapeutic activities;Cognitive remediation/compensation;Patient/family education;Balance training    OT Goals(Current goals can be found in the care plan section) Acute Rehab OT Goals OT Goal Formulation: With patient Time For Goal Achievement: 07/08/17 Potential to Achieve Goals: Good ADL Goals Pt Will Perform Grooming: with modified independence;standing;sitting Pt Will Perform Upper Body Dressing: with modified independence;sitting Pt Will Perform Lower Body Dressing: with modified independence;sit to/from stand Pt Will Transfer to Toilet: with modified independence;ambulating Pt Will Perform Toileting - Clothing Manipulation and hygiene: with modified independence;sit to/from stand Pt Will Perform Tub/Shower Transfer: with modified independence;ambulating;rolling walker;3 in 1 Additional ADL Goal #1: Pt will  independently verbalize 2 fall prevention strategies as they relate to ADLs.  OT Frequency: Min 2X/week   Barriers to D/C: Decreased caregiver support  home alone, identifies 1 neighbor who could check in on him       Co-evaluation              AM-PAC PT "6 Clicks" Daily Activity     Outcome Measure Help from another person eating meals?: None Help from another person taking care of personal grooming?: A Little Help from another person toileting, which includes using toliet, bedpan, or urinal?: A Little Help from another person bathing (including washing, rinsing, drying)?: A Lot Help from another person to put on and taking off regular upper body clothing?: A Little Help from another person to put on and taking off regular lower body clothing?: A Lot 6 Click Score: 17   End of Session Equipment Utilized During Treatment: Gait belt  Activity Tolerance: Patient tolerated treatment well Patient left: in chair;with call bell/phone within reach;with chair alarm set  OT Visit Diagnosis: Unsteadiness on feet (R26.81);Repeated falls (R29.6);Muscle weakness (generalized) (M62.81);History of falling (Z91.81);Other symptoms and signs involving cognitive function                Time: 8338-2505 OT Time Calculation (min): 22 min Charges:  OT General Charges $OT Visit: 1 Visit OT Evaluation $OT Eval Low Complexity: 1 Low G-Codes:       Hortencia Pilar  07/01/2017, 11:44 AM

## 2017-07-02 ENCOUNTER — Encounter (HOSPITAL_COMMUNITY): Payer: Self-pay

## 2017-07-02 DIAGNOSIS — I6200 Nontraumatic subdural hemorrhage, unspecified: Secondary | ICD-10-CM

## 2017-07-02 DIAGNOSIS — E871 Hypo-osmolality and hyponatremia: Secondary | ICD-10-CM

## 2017-07-02 DIAGNOSIS — S065X9A Traumatic subdural hemorrhage with loss of consciousness of unspecified duration, initial encounter: Secondary | ICD-10-CM

## 2017-07-02 DIAGNOSIS — S065XAA Traumatic subdural hemorrhage with loss of consciousness status unknown, initial encounter: Secondary | ICD-10-CM

## 2017-07-02 DIAGNOSIS — G9340 Encephalopathy, unspecified: Secondary | ICD-10-CM

## 2017-07-02 LAB — MAGNESIUM: MAGNESIUM: 1.9 mg/dL (ref 1.7–2.4)

## 2017-07-02 LAB — BASIC METABOLIC PANEL
Anion gap: 8 (ref 5–15)
BUN: 15 mg/dL (ref 6–20)
CHLORIDE: 102 mmol/L (ref 101–111)
CO2: 27 mmol/L (ref 22–32)
Calcium: 8.4 mg/dL — ABNORMAL LOW (ref 8.9–10.3)
Creatinine, Ser: 0.97 mg/dL (ref 0.61–1.24)
GFR calc Af Amer: >60 mL/min (ref >60)
GFR calc non Af Amer: >60 mL/min (ref >60)
GLUCOSE: 68 mg/dL (ref 65–99)
POTASSIUM: 3.8 mmol/L (ref 3.5–5.1)
Sodium: 137 mmol/L (ref 135–145)

## 2017-07-02 MED ORDER — VITAMIN B-12 100 MCG PO TABS
100.0000 ug | ORAL_TABLET | Freq: Every day | ORAL | Status: DC
  Administered 2017-07-02: 100 ug via ORAL
  Filled 2017-07-02: qty 1

## 2017-07-02 MED ORDER — CYANOCOBALAMIN 100 MCG PO TABS: 100.0000 ug | ORAL_TABLET | Freq: Every day | ORAL | 0 refills | Status: AC

## 2017-07-02 MED ORDER — ENSURE ENLIVE PO LIQD: 237.0000 mL | Freq: Three times a day (TID) | ORAL | 12 refills | Status: AC

## 2017-07-02 MED ORDER — LEVOTHYROXINE SODIUM 100 MCG PO TABS: 100.0000 ug | ORAL_TABLET | Freq: Every day | ORAL | 1 refills | Status: AC

## 2017-07-02 MED ORDER — ACETAMINOPHEN 325 MG PO TABS: 650.0000 mg | ORAL_TABLET | Freq: Four times a day (QID) | ORAL | 0 refills | Status: AC | PRN

## 2017-07-02 NOTE — Progress Notes (Addendum)
CM talked to patient about HHC, patient is refusing all home health care at this time; he works at [employer] place; [facility]; he stated that he plans to go there at discharge to get his medication. SW called for bus pass; Aneta Mins [phone]  10:19 pm- Patient changed his mind and now wants [place] provided by [facility]; Don with [facility] called for arrangements; Also patient will be discharged home via ambulance, home address verified / subdural hematoma from recent fall; B Pennie Rushing [phone]

## 2017-07-02 NOTE — Discharge Summary (Addendum)
Physician Discharge Summary  Donald Myers:[id number] DOB: September 12, 1955 (61 years old) DOA: 06/28/2017 (61 years old)  PCP: No primary care provider on file.  Admit date: 06/28/2017 Discharge date: 07/02/2017  Admitted From: Home  Disposition:  Home   Recommendations for Outpatient Follow-up:  1. Follow up with PCP in 1-2 weeks 2. Please obtain BMP/CBC in one week 3. Follow up with neurosurgery for cervical arthropathy and post subdural hematoma.  4. Needs repeat TSH.  5.   Home Health; yes.  Equipment/Devices: DME 3;1  Discharge Condition: stable.  CODE STATUS: full code.  Diet recommendation: Heart Healthy  Brief/Interim Summary: Donald Myers brought to [place] after he fell down trying to get on a bus earlier today. He was found to be bradycardic hypotensive and extremely hypothermic. He does have a history of hypothyroidism and claims that he takes his medicine but his TSH in the emergency room is 31. He denies any fevers chills or sweats, he denies any cough, he denies dysuria, he denies abdominal pain. He denies having been standing at the bus stop for an extended period of time this morning.   Assessment & Plan:   1-Hypotension, Hypothermia; he was treated with IV fluids, IV steroid stress dose, and was on levophed. Hypotension related to hypovolemia.  Hypothermia thought to be related to environmental exposure.  Unlikely infection pro-calcitonin low,  Cortisol 57. Urine culture no growth. Blood culture no growth.  Resolved.   Bilateral subdural Hematoma; stable. Avoid anticoagulation.  Stable.   Epidural edema C 3-C 6. Bone contusion C - 1- C 2, cervical spondylosis C 5-6, right anterior cord impingement.  MRI reviewed by neurosurgery, recommended out patient follow up, if patient develops severe pain.  No focal deficit on exam.   Fall, PT , OT evaluation. [facility] pt nurse arrange.   Acute encephalopathy; due to trauma. Improving.  B 12 low normal. Start supplement.   Hypomagnesemia;  replete IV. Normalized.   Hyponatremia, mild increase CK; rhabdo ; continue with IV fluids.  Ck trending down.   Hypothyroidism; TSH at 89. Restarted on synthroid.   History of A fib; metoprolol on hold due to bradycardia.  He was not taking coumadin, will not resume due to subdural hematoma.     Discharge Diagnoses:  Active Problems:   Atrial fibrillation (HCC)   Hypothyroidism   Hypothermia   Subdural hematoma (HCC)   Encephalopathy acute   Hyponatremia    Discharge Instructions  Discharge Instructions    Diet - low sodium heart healthy   Complete by:  As directed    Increase activity slowly   Complete by:  As directed      Allergies as of 07/02/2017   No Known Allergies     Medication List    STOP taking these medications   lisinopril 5 MG tablet Commonly known as:  PRINIVIL,ZESTRIL   metoprolol succinate 50 MG 24 hr tablet Commonly known as:  TOPROL-XL   warfarin 5 MG tablet Commonly known as:  COUMADIN     TAKE these medications   acetaminophen 325 MG tablet Commonly known as:  TYLENOL Take 2 tablets (650 mg total) by mouth every 6 (six) hours as needed for mild pain, moderate pain or headache.   cyanocobalamin 100 MCG tablet Take 1 tablet (100 mcg total) by mouth daily. Start taking on:  07/03/2017   feeding supplement (ENSURE ENLIVE) Liqd Take 237 mLs by mouth 3 (three) times daily between meals.   levothyroxine 100 MCG tablet Commonly known as:  SYNTHROID, LEVOTHROID Take 1  tablet (100 mcg total) by mouth daily before breakfast. Start taking on:  07/03/2017 What changed:    medication strength  how much to take  when to take this            Durable Medical Equipment  (From admission, onward)        Start     Ordered   07/02/17 1001  For home use only DME 3 n 1  Once     07/02/17 1000     Follow-up [facility] up on 07/18/2017.   Why:  11 am for hospital follow  up, you will be able to utilize the [facility] for medication ast, they are closed on the weekends Contact information: [place] [id number] [facility].   Why:  utilize the pharmacy for medication ast Contact information: [place] [id number] [phone]       Kary Kos, MD Follow up in 3 week(s).   Specialty:  Neurosurgery Contact information: [address] [place] [address] [phone]          No Known Allergies  Consultations:  Neurosurgery , phone consultation    Procedures/Studies: Ct Head Wo Contrast  Result Date: 06/28/2017 CLINICAL DATA:  Pain following fall EXAM: CT HEAD WITHOUT CONTRAST CT CERVICAL SPINE WITHOUT CONTRAST TECHNIQUE: Multidetector CT imaging of the head and cervical spine was performed following the standard protocol without intravenous contrast. Multiplanar CT image reconstructions of the cervical spine were also generated. COMPARISON:  Head CT August 27, 2008 FINDINGS: CT HEAD FINDINGS Brain: The ventricles are normal in size and configuration. There is a subdural hematoma over the right frontal and superior temporal lobes with a maximum thickness of 3 mm. There is a subdural hematoma on the left over the superior temporal region with a maximum thickness of just over 2 mm. There is no intra-axial fluid. No midline shift. No mass evident. There is slight small vessel disease in the posterior centra semiovale bilaterally. Elsewhere gray-white compartments appear normal. No acute infarct evident. Vascular: No hyperdense vessel. There is calcification in each carotid siphon. Skull: Bony calvarium appears intact. Sinuses/Orbits: There is opacification in multiple ethmoid air cells. There is nares edema with obstruction of the nasal cavities bilaterally. Visualized orbits  appear symmetric bilaterally. Other: Mastoid air cells are clear. There is debris in each external auditory canal. CT CERVICAL SPINE FINDINGS Alignment: There is 3 mm of anterolisthesis of C3 on C4. There is 2 mm of retrolisthesis of C5 on C6. There is 2 mm of retrolisthesis of C6 on C7. Skull base and vertebrae: There is advanced erosive change involving the superior aspect of the odontoid with pannus in this area. Skull base and craniocervical junction regions appear normal. No acute fracture is evident. There is advanced erosive change in the posterior elements on the left at C3. There is widening of the foramen transversariae on the left at C3 and C4. There are no blastic lesions. Soft tissues and spinal canal: No soft tissue masses are evident. No paraspinous lesions are appreciable. No cord or canal hematoma is appreciable. Disc levels: There is marked disc space narrowing at C5-6 and C6-7. There is multilevel facet osteoarthritic change. No frank disc extrusion or high-grade stenosis. 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 Other: There is  atherosclerotic calcification in both carotid arteries. IMPRESSION: CT head: 1. Small subdural hematomas on each side. The right subdural hematoma seen at the superior right temporal and posterior right frontal region measures 3 mm in maximum thickness. The subdural hematoma on the left at the superiorly left temporal region measures just over 2 mm. There is no appreciable mass effect. No midline shift. 2.  No intra-axial hemorrhage. 3. Mild periventricular small vessel disease. No evident acute infarct. 4.  There are foci of arterial vascular calcification. 5. Areas of paranasal sinus disease noted. There is edema causing obstruction of each nasal cavity. 6.  Probable cerumen in each external auditory canal. CT cervical spine: 1.  No acute fracture. 2. Areas of mild spondylolisthesis are felt to be due to underlying spondylosis. 3. Extensive erosive  arthropathy in the odontoid region as well as on the left at C3 in the posterior element regions. Suspect underlying erosive arthropathy. Note that the predental space region is not widened. Underlying neoplasm potentially could present in this manner. Given this overall appearance, it may be prudent to correlate the cervical MRI pre and post-contrast to further evaluate. Widening of the foramen transversarium at C3 and C4 on the left is of uncertain etiology. Donald correlation with respect to these changes may also be helpful. 4.  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 5.  There is calcification in each carotid artery. Critical Value/emergent results were called by telephone at the time of interpretation on 06/28/2017 at 5:03 pm to Dr. Theotis Burrow , who verbally acknowledged these results. Electronically Signed   By: Lowella Grip III M.D.   On: 06/28/2017 17:03   Ct Cervical Spine Wo Contrast  Result Date: 06/28/2017 CLINICAL DATA:  Pain following fall EXAM: CT HEAD WITHOUT CONTRAST CT CERVICAL SPINE WITHOUT CONTRAST TECHNIQUE: Multidetector CT imaging of the head and cervical spine was performed following the standard protocol without intravenous contrast. Multiplanar CT image reconstructions of the cervical spine were also generated. COMPARISON:  Head CT August 27, 2008 FINDINGS: CT HEAD FINDINGS Brain: The ventricles are normal in size and configuration. There is a subdural hematoma over the right frontal and superior temporal lobes with a maximum thickness of 3 mm. There is a subdural hematoma on the left over the superior temporal region with a maximum thickness of just over 2 mm. There is no intra-axial fluid. No midline shift. No mass evident. There is slight small vessel disease in the posterior centra semiovale bilaterally. Elsewhere gray-white compartments appear normal. No acute infarct evident. Vascular: No hyperdense vessel. There is calcification in each carotid siphon. Skull: Bony  calvarium appears intact. Sinuses/Orbits: There is opacification in multiple ethmoid air cells. There is nares edema with obstruction of the nasal cavities bilaterally. Visualized orbits appear symmetric bilaterally. Other: Mastoid air cells are clear. There is debris in each external auditory canal. CT CERVICAL SPINE FINDINGS Alignment: There is 3 mm of anterolisthesis of C3 on C4. There is 2 mm of retrolisthesis of C5 on C6. There is 2 mm of retrolisthesis of C6 on C7. Skull base and vertebrae: There is advanced erosive change involving the superior aspect of the odontoid with pannus in this area. Skull base and craniocervical junction regions appear normal. No acute fracture is evident. There is advanced erosive change in the posterior elements on the left at C3. There is widening of the foramen transversariae on the left at C3 and C4. There are no blastic lesions. Soft tissues and spinal canal: No soft tissue masses are evident.  No paraspinous lesions are appreciable. No cord or canal hematoma is appreciable. Disc levels: There is marked disc space narrowing at C5-6 and C6-7. There is multilevel facet osteoarthritic change. No frank disc extrusion or high-grade stenosis. 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 Other: There is atherosclerotic calcification in both carotid arteries. IMPRESSION: CT head: 1. Small subdural hematomas on each side. The right subdural hematoma seen at the superior right temporal and posterior right frontal region measures 3 mm in maximum thickness. The subdural hematoma on the left at the superiorly left temporal region measures just over 2 mm. There is no appreciable mass effect. No midline shift. 2.  No intra-axial hemorrhage. 3. Mild periventricular small vessel disease. No evident acute infarct. 4.  There are foci of arterial vascular calcification. 5. Areas of paranasal sinus disease noted. There is edema causing obstruction of each nasal cavity. 6.  Probable cerumen  in each external auditory canal. CT cervical spine: 1.  No acute fracture. 2. Areas of mild spondylolisthesis are felt to be due to underlying spondylosis. 3. 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 Suspect underlying erosive arthropathy. Note that the predental space region is not widened. Underlying neoplasm potentially could present in this manner. Given this overall appearance, it may be prudent to correlate the cervical MRI pre and post-contrast to further evaluate. Widening of the foramen transversarium at C3 and C4 on the left is of uncertain etiology. Donald correlation with respect to these changes may also be helpful. 4.  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 5.  There is calcification in each carotid artery. Critical Value/emergent results were called by telephone at the time of interpretation on 06/28/2017 at 5:03 pm to Dr. Theotis Burrow , who verbally acknowledged these results. Electronically Signed   By: Lowella Grip III M.D.   On: 06/28/2017 17:03   Donald Cervical Spine W Wo Contrast  Result Date: 06/29/2017 CLINICAL DATA:  61 y/o M; neck pain with abnormal cervical spine CT. EXAM: MRI CERVICAL SPINE WITHOUT AND WITH CONTRAST TECHNIQUE: Multiplanar and multiecho pulse sequences of the cervical spine, to include the craniocervical junction and cervicothoracic junction, were obtained without and with intravenous contrast. CONTRAST:  36mL MULTIHANCE GADOBENATE DIMEGLUMINE 529 MG/ML IV SOLN COMPARISON:  06/28/2017 CT cervical spine FINDINGS: Motion degradation on multiple sequences. Alignment: C3-4 grade 1 anterolisthesis and reversal of cervical curvature. Vertebrae: Mild edema is present within odontoid process and lateral masses of C1. No significant C1-2 surrounding soft tissue pannus. Erosive changes of the odontoid process with sclerosis are better characterized on the CT of the cervical spine. Mild opposing endplate  edema is present at the C5-6 level without intervening abnormal disc signal. No loss of vertebral body height. No significant edema or enhancement of the facet joints. No evidence for neoplasm or discitis. Cord: No abnormal cord signal or enhancement identified. There is fluid in the epidural space posteriorly from C3-C6 without abnormal enhancement. Posterior Fossa, vertebral arteries, paraspinal tissues: There is a small volume of prevertebral fluid at the C3-C6 levels without enhancement. Disc levels: C2-3: No significant disc displacement, foraminal stenosis, or canal stenosis. Bilateral facet arthropathy. C3-4: Disc osteophyte complex with uncovertebral and facet hypertrophy severe on the left. Moderate right and severe left foraminal stenosis. Mild canal stenosis. C4-5: Disc osteophyte complex with uncovertebral and facet hypertrophy, severe on the left. Mild right and moderate left foraminal stenosis. Mild canal stenosis. C5-6: Large disc osteophyte  complex eccentric to the right with extensive bilateral uncovertebral and facet hypertrophy. Severe bilateral foraminal stenosis. Moderate canal stenosis with right anterior cord impingement and flattening. C6-7: Disc osteophyte complex with left-greater-than-right uncovertebral and facet hypertrophy. Moderate to severe bilateral foraminal stenosis. Mild canal stenosis. C7-T1: No significant disc displacement, foraminal stenosis, or canal stenosis. IMPRESSION: 1. Small volume of prevertebral and epidural edema is present from the C3 through C6 levels. Additionally, there is edema within the C5 and C6 vertebral bodies without loss of vertebral body height. In the setting of trauma this probably represents underlying ligamentous injury and bone contusion. 2. No abnormal cord signal or enhancement. 3. Mild edema within odontoid process and lateral masses of C1 vertebral body may represent bone contusion in the setting of trauma or be related to degenerative changes  of the anterior C1-2 articulation. 4. No significant soft tissue pannus associated with odontoid process to suggest inflammatory arthropathy such as rheumatoid arthritis, gout, or CPPD. Similarly there is no abnormal enhancement of the facet joints. 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 5. Advanced cervical spondylosis greatest at the C5-6 level with there is moderate canal stenosis and right anterior cord impingement. Electronically Signed   By: Kristine Garbe M.D.   On: 06/29/2017 22:53   Dg Chest Port 1 View  Result Date: 06/28/2017 CLINICAL DATA:  Altered mental status, hypothermia. History of COPD, atrial fibrillation, and cardiomyopathy. Current smoker. EXAM: PORTABLE CHEST 1 VIEW COMPARISON:  PA and lateral chest x-ray of November 08, 2009 FINDINGS: The lungs are mildly hypoinflated. There are increased lung markings in the infrahilar regions bilaterally. There is no pleural effusion or pneumothorax. The heart is top-normal in size. The pulmonary vascularity is normal. There is calcification in the wall of the aortic arch. An external pacemaker defibrillator pad is present. IMPRESSION: Bibasilar atelectasis or pneumonia. No CHF. Follow-up PA and lateral chest X-ray is recommended in 3-4 weeks following trial of antibiotic therapy to ensure resolution and exclude underlying malignancy. Thoracic aortic atherosclerosis. Electronically Signed   By: David  Martinique M.D.   On: 06/28/2017 09:50      Subjective: He is feeling ok, denies pain   Discharge Exam: Vitals:   07/01/17 1955 07/02/17 0627  BP: 110/73 116/82  Pulse: (!) 58 (!) 59  Resp: 18 18  Temp: 97.7 F (36.5 C) 97.6 F (36.4 C)  SpO2: 96% 97%   Vitals:   07/01/17 1200 07/01/17 1955 07/02/17 0612 07/02/17 0627  BP: 106/73 110/73  116/82  Pulse: (!) 58 (!) 58  (!) 59  Resp: 18 18  18   Temp: (!) 97.5 F (36.4 C) 97.7 F (36.5 C)  97.6 F (36.4 C)  TempSrc: Oral Oral  Oral  SpO2: 93% 96%  97%   Weight:   96.8 kg (213 lb 4.8 oz)   Height:   6\' 1"  (1.854 m)     General: Pt is alert, awake, not in acute distress Cardiovascular: RRR, S1/S2 +, no rubs, no gallops Respiratory: CTA bilaterally, no wheezing, no rhonchi Abdominal: Soft, NT, ND, bowel sounds + Extremities: no edema, no cyanosis    The results of significant diagnostics from this hospitalization (including imaging, microbiology, ancillary and laboratory) are listed below for reference.     Microbiology: Recent Results (from the past 240 hour(s))  Blood Culture (routine x 2)     Status: None (Preliminary result)   Collection Time: 06/28/17  9:55 AM  Result Value Ref Range Status   Specimen Description BLOOD LEFT ANTECUBITAL  Final   Special Requests   Final    BOTTLES DRAWN AEROBIC AND ANAEROBIC Blood Culture adequate volume   Culture NO GROWTH 3 DAYS  Final   Report Status PENDING  Incomplete  Blood Culture (routine x 2)     Status: None (Preliminary result)   Collection Time: 06/28/17 10:02 AM  Result Value Ref Range Status   Specimen Description BLOOD RIGHT ANTECUBITAL  Final   Special Requests   Final    BOTTLES DRAWN AEROBIC AND ANAEROBIC Blood Culture adequate volume   Culture NO GROWTH 3 DAYS  Final   Report Status PENDING  Incomplete  MRSA PCR Screening     Status: None   Collection Time: 06/28/17  6:11 PM  Result Value Ref Range Status   MRSA by PCR NEGATIVE NEGATIVE Final    Comment:        The GeneXpert MRSA Assay (FDA approved for NASAL specimens only), is one component of a comprehensive MRSA colonization surveillance program. It is not intended to diagnose MRSA infection nor to guide or monitor treatment for MRSA infections.   Urine culture     Status: None   Collection Time: 06/29/17 12:15 AM  Result Value Ref Range Status   Specimen Description URINE, CLEAN CATCH  Final   Special Requests Normal  Final   Culture NO GROWTH  Final   Report Status 06/30/2017 FINAL  Final      Labs: BNP (last 3 results) No results for input(s): BNP in the last 8760 hours. Basic Metabolic Panel: Recent Labs  Lab 06/28/17 0948 06/28/17 1859 06/29/17 0400 06/30/17 0841 07/01/17 0456 07/02/17 0428  NA 134*  --  132* 134* 137 137  K 3.7  --  3.7 3.9 4.1 3.8  CL 100*  --  100* 100* 103 102  CO2 24  --  21* 26 28 27   GLUCOSE 107*  --  106* 66 72 68  BUN 19  --  15 15 15 15   CREATININE 0.99 0.99 1.11 1.15 1.24 0.97  CALCIUM 8.7*  --  8.2* 8.7* 8.6* 8.4*  MG  --   --  1.6*  --   --  1.9  PHOS  --   --  3.3  --   --   --    Liver Function Tests: Recent Labs  Lab 06/28/17 0948 06/29/17 0400  AST 41 31  ALT 36 31  ALKPHOS 51 48  BILITOT 1.4* 1.4*  PROT 6.6 6.5  ALBUMIN 3.7 3.3*   No results for input(s): LIPASE, AMYLASE in the last 168 hours. No results for input(s): AMMONIA in the last 168 hours. CBC: Recent Labs  Lab 06/28/17 0948 06/28/17 1859 06/29/17 0400 07/01/17 0456  WBC 5.3 5.6 7.7 4.8  NEUTROABS 3.9  --   --   --   HGB 10.7* 9.7* 9.9* 9.3*  HCT 32.1* 29.2* 29.8* 28.6*  MCV 93.0 92.4 93.1 95.3  PLT 103* 108* 126* 96*   Cardiac Enzymes: Recent Labs  Lab 06/28/17 0948 06/29/17 0400 06/30/17 0841 07/01/17 0456  CKTOTAL 1,495* 990* 941* 700*   BNP: Invalid input(s): POCBNP CBG: Recent Labs  Lab 06/28/17 1235 06/28/17 2014  GLUCAP 73 88   D-Dimer No results for input(s): DDIMER in the last 72 hours. Hgb A1c No results for input(s): HGBA1C in the last 72 hours. Lipid Profile No results for input(s): CHOL, HDL, LDLCALC, TRIG, CHOLHDL, LDLDIRECT in the last 72 hours. Thyroid function studies No results for input(s): TSH, T4TOTAL, T3FREE,  THYROIDAB in the last 72 hours.  Invalid input(s): FREET3 Anemia work up Recent Labs    07/01/17 1035  VITAMINB12 247   Urinalysis    Component Value Date/Time   COLORURINE YELLOW 06/29/2017 0037   APPEARANCEUR CLEAR 06/29/2017 0037   LABSPEC 1.018 06/29/2017 0037   PHURINE 5.0 06/29/2017  0037   GLUCOSEU NEGATIVE 06/29/2017 0037   HGBUR MODERATE (A) 06/29/2017 0037   BILIRUBINUR NEGATIVE 06/29/2017 0037   KETONESUR 5 (A) 06/29/2017 0037   PROTEINUR 100 (A) 06/29/2017 0037   UROBILINOGEN 1.0 08/26/2008 2150   NITRITE NEGATIVE 06/29/2017 0037   LEUKOCYTESUR NEGATIVE 06/29/2017 0037   Sepsis Labs Invalid input(s): PROCALCITONIN,  WBC,  LACTICIDVEN Microbiology Recent Results (from the past 240 hour(s))  Blood Culture (routine x 2)     Status: None (Preliminary result)   Collection Time: 06/28/17  9:55 AM  Result Value Ref Range Status   Specimen Description BLOOD LEFT ANTECUBITAL  Final   Special Requests   Final    BOTTLES DRAWN AEROBIC AND ANAEROBIC Blood Culture adequate volume   Culture NO GROWTH 3 DAYS  Final   Report Status PENDING  Incomplete  Blood Culture (routine x 2)     Status: None (Preliminary result)   Collection Time: 06/28/17 10:02 AM  Result Value Ref Range Status   Specimen Description BLOOD RIGHT ANTECUBITAL  Final   Special Requests   Final    BOTTLES DRAWN AEROBIC AND ANAEROBIC Blood Culture adequate volume   Culture NO GROWTH 3 DAYS  Final   Report Status PENDING  Incomplete  MRSA PCR Screening     Status: None   Collection Time: 06/28/17  6:11 PM  Result Value Ref Range Status   MRSA by PCR NEGATIVE NEGATIVE Final    Comment:        The GeneXpert MRSA Assay (FDA approved for NASAL specimens only), is one component of a comprehensive MRSA colonization surveillance program. It is not intended to diagnose MRSA infection nor to guide or monitor treatment for MRSA infections.   Urine culture     Status: None   Collection Time: 06/29/17 12:15 AM  Result Value Ref Range Status   Specimen Description URINE, CLEAN CATCH  Final   Special Requests Normal  Final   Culture NO GROWTH  Final   Report Status 06/30/2017 FINAL  Final     Time coordinating discharge: Over 30 minutes  SIGNED:   Elmarie Shiley, MD  [facility] 07/02/2017, 11:11 AM Pager   If 7PM-7AM, please contact night-coverage [email or web address] Password [email or web address]

## 2017-07-02 NOTE — Progress Notes (Signed)
Patient is preparing to discharge from unit to home via ambulance. IV discontinued from right arm without difficulty. Site wnl.  Patient wallet retrieved from security with 477.00$ in it as witnessed by Lauren RN, this scriber and patient. Wallet returned to patient. Pt states his drivers license is not in wallet. When checking thru patient belongings drivers license found in bag with his hat and shoes. Patient demonstrates no s/sx of distress. All discharge instructions reviewed with patient as well as medications and follow up appointments.   Case manager has arranged transportation via ambulance transport to pt home.

## 2017-07-03 LAB — CULTURE, BLOOD (ROUTINE X 2)
Culture: NO GROWTH
Culture: NO GROWTH
SPECIAL REQUESTS: ADEQUATE
Special Requests: ADEQUATE

## 2017-07-18 ENCOUNTER — Inpatient Hospital Stay (INDEPENDENT_AMBULATORY_CARE_PROVIDER_SITE_OTHER): Payer: Self-pay | Admitting: Physician Assistant

## 2017-08-24 DEATH — deceased

## 2018-12-05 IMAGING — CT CT HEAD W/O CM
5 of 8 series · 15 of 47 positions shown, 16 images · non-contrast
Comparison: Head CT August 27, 2008

CLINICAL DATA: Pain following fall

EXAM:
CT HEAD WITHOUT CONTRAST
CT CERVICAL SPINE WITHOUT CONTRAST
TECHNIQUE: Multidetector CT imaging of the head and cervical spine was
performed following the standard protocol without intravenous
contrast. Multiplanar CT image reconstructions of the cervical spine
were also generated.

[Series 3: head without · axial · non-contrast · 0.47mm/px · z∈[-93,-43]mm · 2 of 32 slices shown, 3 images]
[im 11/32  brain]
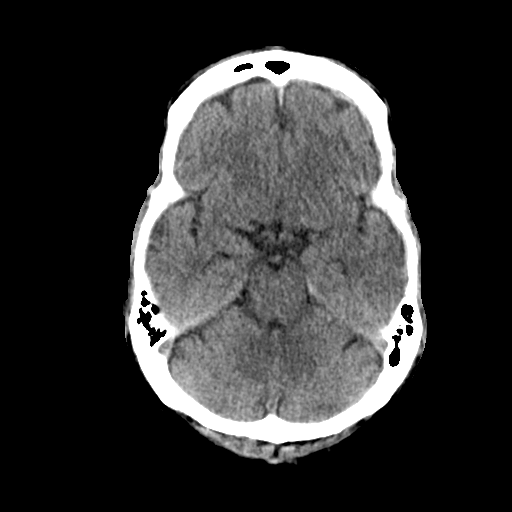
[im 11/32  bone]
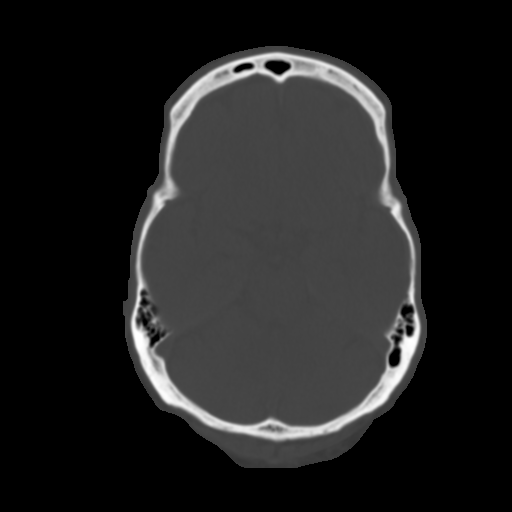
[im 21/32  brain]
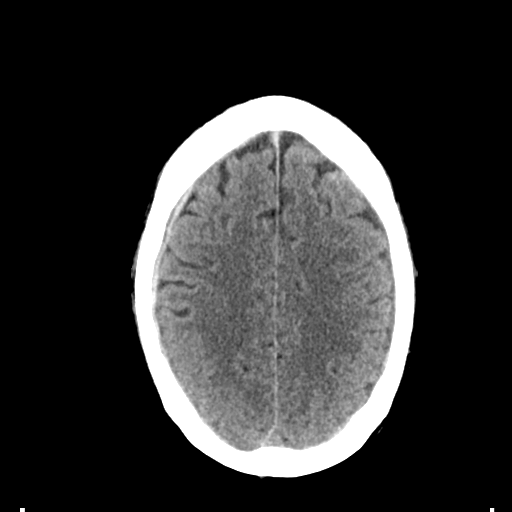

[Series 4: head bone · axial · 0.47mm/px · z∈[-121,-11]mm · 6 of 79 slices shown]
[im 12/79  bone]
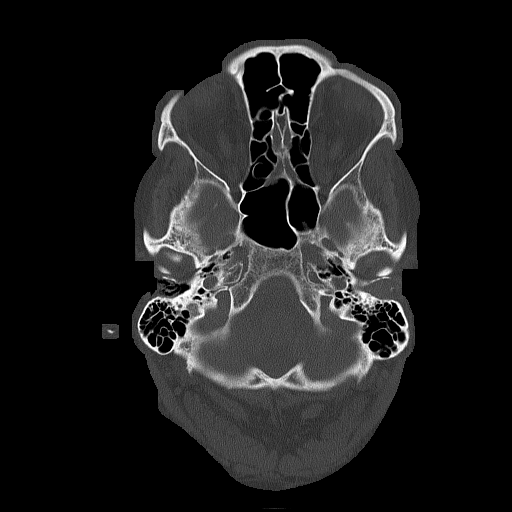
[im 23/79  bone]
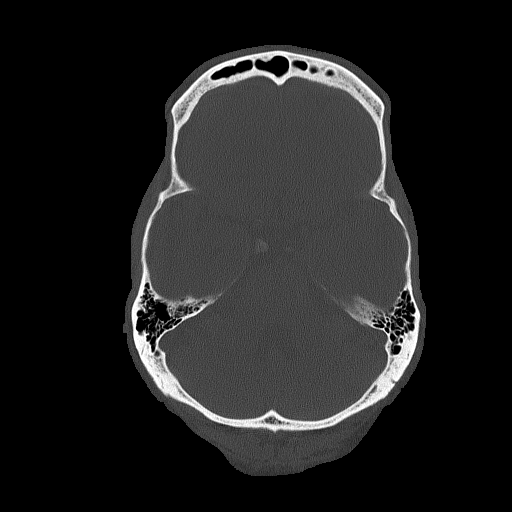
[im 34/79  bone]
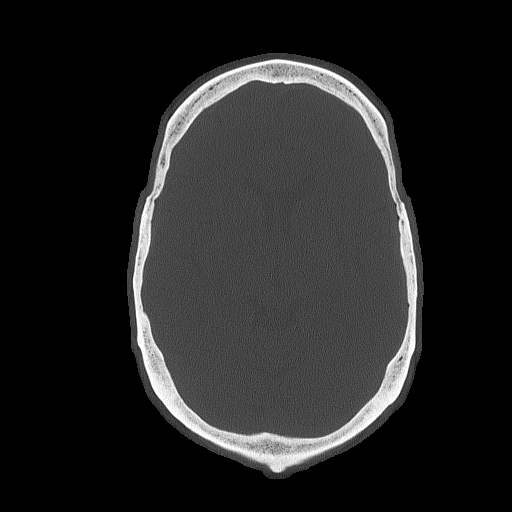
[im 45/79  bone]
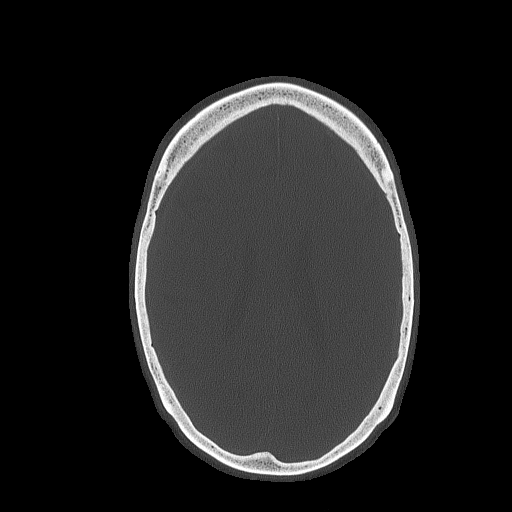
[im 56/79  bone]
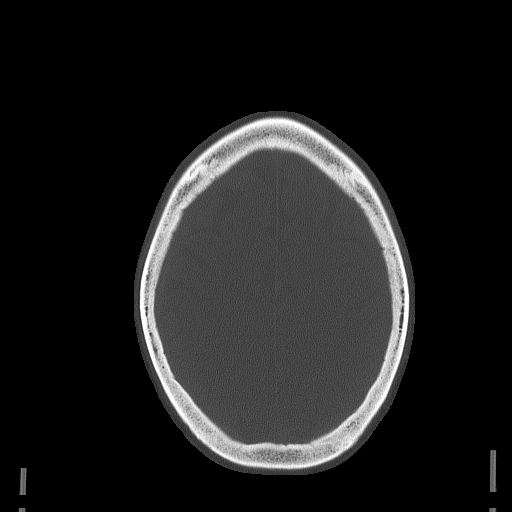
[im 67/79  bone]
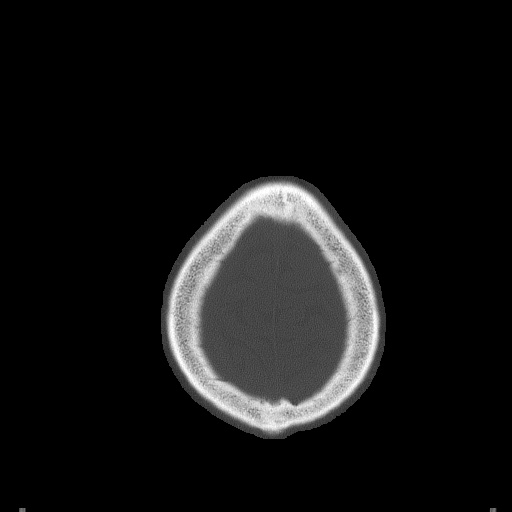

[Series 5: head without cor · coronal · non-contrast · 0.32mm/px · 2 of 75 slices shown]
[im 15/75  brain]
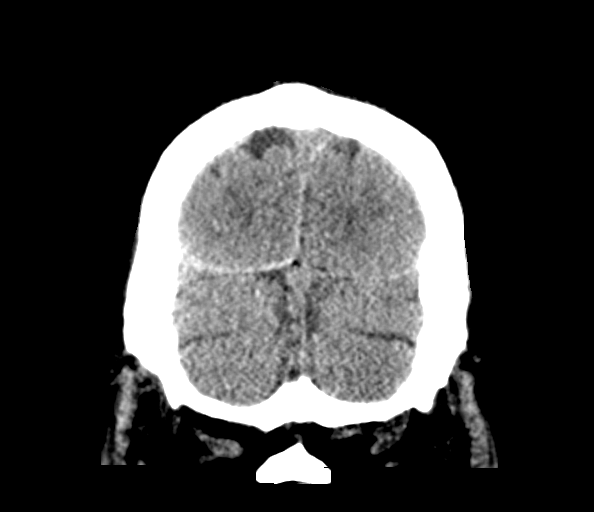
[im 29/75  brain]
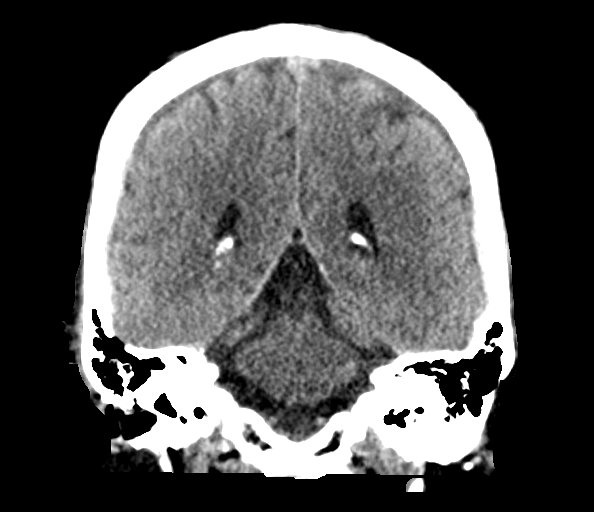

[Series 6: head without sag · sagittal · non-contrast · 0.34mm/px · 2 of 67 slices shown]
[im 23/67  brain]
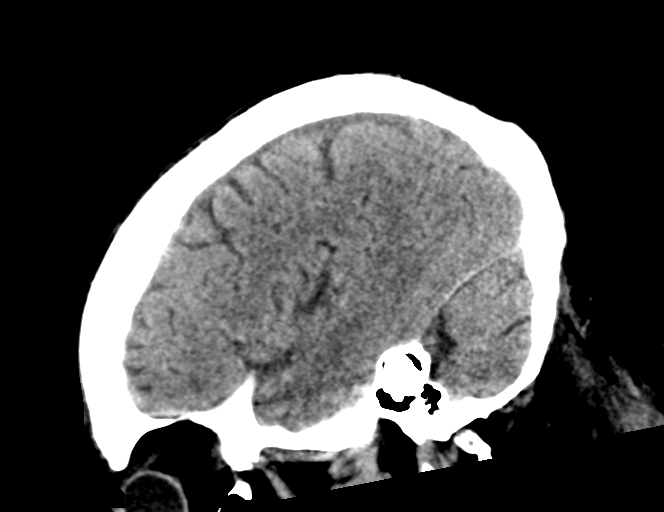
[im 45/67  brain]
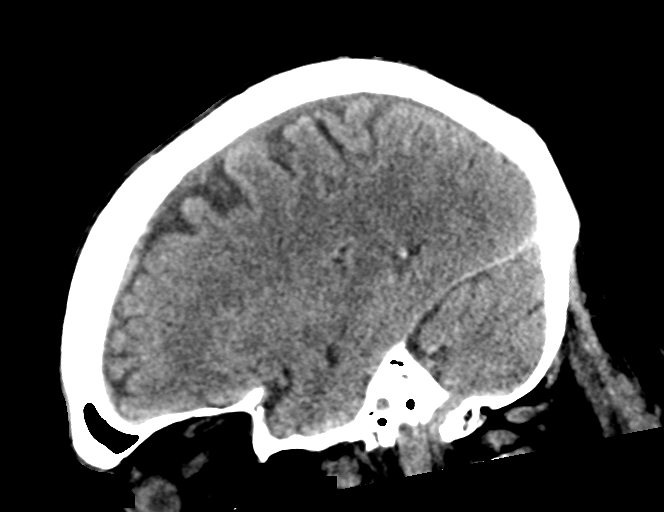

[Series 7: c_spine 2.0 st · axial · 0.30mm/px · z∈[-287,-243]mm · 3 of 89 slices shown]
[im 12/89  brain]
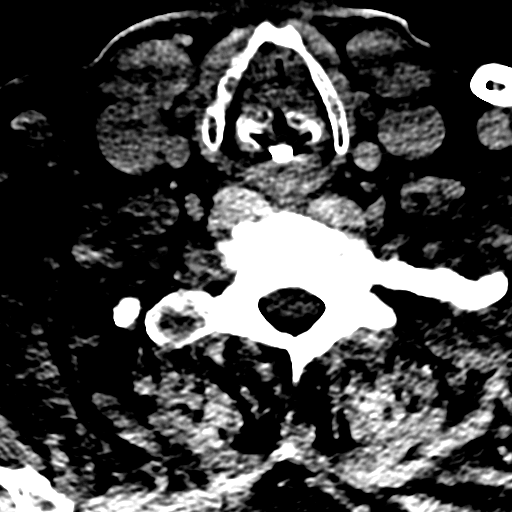
[im 23/89  brain]
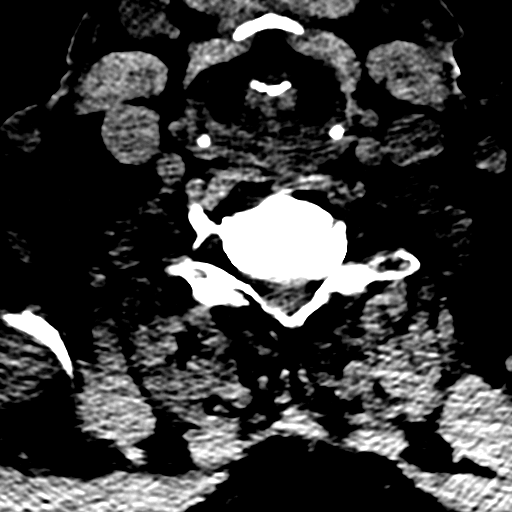
[im 34/89  brain]
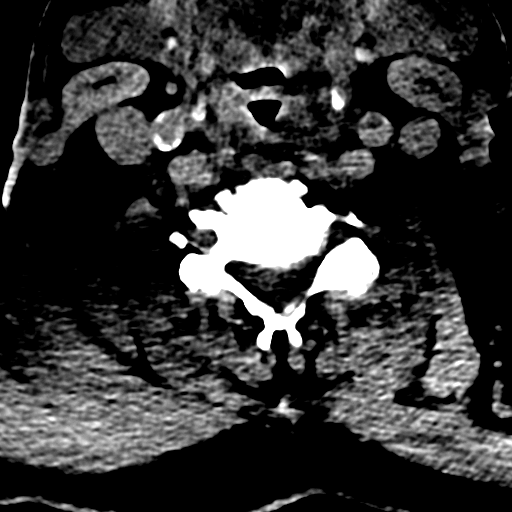

[15 of 47 positions shown; findings below may reference images not displayed]

FINDINGS: CT HEAD FINDINGS

Brain: The ventricles are normal in size and configuration. There is
a subdural hematoma over the right frontal and superior temporal
lobes with a maximum thickness of 3 mm. There is a subdural hematoma
on the left over the superior temporal region with a maximum
thickness of just over 2 mm. There is no intra-axial fluid. No
midline shift. No mass evident. There is slight small vessel disease
in the posterior centra semiovale bilaterally. Elsewhere gray-white
compartments appear normal. No acute infarct evident.

Vascular: No hyperdense vessel. There is calcification in each
carotid siphon.

Skull: Bony calvarium appears intact.

Sinuses/Orbits: There is opacification in multiple ethmoid air
cells. There is nares edema with obstruction of the nasal cavities
bilaterally. Visualized orbits appear symmetric bilaterally.

Other: Mastoid air cells are clear. There is debris in each external
auditory canal.

CT CERVICAL SPINE FINDINGS

Alignment: There is 3 mm of anterolisthesis of C3 on C4. There is 2
mm of retrolisthesis of C5 on C6. There is 2 mm of retrolisthesis of
C6 on C7.

Skull base and vertebrae: There is advanced erosive change involving
the superior aspect of the odontoid with pannus in this area. Skull
base and craniocervical junction regions appear normal. No acute
fracture is evident.

There is advanced erosive change in the posterior elements on the
left at C3. There is widening of the foramen transversariae on the
left at C3 and C4. There are no blastic lesions.

Soft tissues and spinal canal: No soft tissue masses are evident. No
paraspinous lesions are appreciable. No cord or canal hematoma is
appreciable.

Disc levels: There is marked disc space narrowing at C5-6 and C6-7.
There is multilevel facet osteoarthritic change. No frank disc
extrusion or high-grade stenosis.

Upper chest: There is bullous disease in lung apices.

Other: There is atherosclerotic calcification in both carotid
arteries.
IMPRESSION: CT head:

1. Small subdural hematomas on each side. The right subdural
hematoma seen at the superior right temporal and posterior right
frontal region measures 3 mm in maximum thickness. The subdural
hematoma on the left at the superiorly left temporal region measures
just over 2 mm. There is no appreciable mass effect. No midline
shift.

2.  No intra-axial hemorrhage.

3. Mild periventricular small vessel disease. No evident acute
infarct.

4.  There are foci of arterial vascular calcification.

5. Areas of paranasal sinus disease noted. There is edema causing
obstruction of each nasal cavity.

6.  Probable cerumen in each external auditory canal.

CT cervical spine:

1.  No acute fracture.

2. Areas of mild spondylolisthesis are felt to be due to underlying
spondylosis.

3. Extensive erosive arthropathy in the odontoid region as well as
on the left at C3 in the posterior element regions. Suspect
underlying erosive arthropathy. Note that the predental space region
is not widened. Underlying neoplasm potentially could present in
this manner. Given this overall appearance, it may be prudent to
correlate the cervical MRI pre and post-contrast to further
evaluate. Widening of the foramen transversarium at C3 and C4 on the
left is of uncertain etiology. MR correlation with respect to these
changes may also be helpful.

4.  There is multilevel osteoarthritic change.

5.  There is calcification in each carotid artery.

Critical Value/emergent results were called by telephone at the time
of interpretation on 06/28/2017 at [DATE] to Dr. REKHA LORING , who
verbally acknowledged these results.
